# Patient Record
Sex: Male | Born: 1960 | Race: White | Hispanic: No | Marital: Married | Smoking: Never smoker
Health system: Southern US, Community
[De-identification: ages and names within clinical notes are randomized; demographics above are authoritative.]

## PROBLEM LIST (undated history)

## (undated) DIAGNOSIS — E785 Hyperlipidemia, unspecified: Secondary | ICD-10-CM

## (undated) DIAGNOSIS — L301 Dyshidrosis [pompholyx]: Secondary | ICD-10-CM

## (undated) DIAGNOSIS — I10 Essential (primary) hypertension: Secondary | ICD-10-CM

## (undated) DIAGNOSIS — I6529 Occlusion and stenosis of unspecified carotid artery: Secondary | ICD-10-CM

## (undated) DIAGNOSIS — F419 Anxiety disorder, unspecified: Secondary | ICD-10-CM

---

## 2015-10-08 DIAGNOSIS — L301 Dyshidrosis [pompholyx]: Secondary | ICD-10-CM | POA: Insufficient documentation

## 2015-10-08 DIAGNOSIS — I6529 Occlusion and stenosis of unspecified carotid artery: Secondary | ICD-10-CM | POA: Insufficient documentation

## 2015-10-08 DIAGNOSIS — I10 Essential (primary) hypertension: Secondary | ICD-10-CM | POA: Insufficient documentation

## 2015-10-08 DIAGNOSIS — Z125 Encounter for screening for malignant neoplasm of prostate: Secondary | ICD-10-CM | POA: Insufficient documentation

## 2015-10-08 DIAGNOSIS — E785 Hyperlipidemia, unspecified: Secondary | ICD-10-CM | POA: Insufficient documentation

## 2015-10-08 DIAGNOSIS — F419 Anxiety disorder, unspecified: Secondary | ICD-10-CM | POA: Insufficient documentation

## 2015-10-08 DIAGNOSIS — E669 Obesity, unspecified: Secondary | ICD-10-CM | POA: Insufficient documentation

## 2016-10-23 DIAGNOSIS — G4709 Other insomnia: Secondary | ICD-10-CM | POA: Insufficient documentation

## 2016-10-29 ENCOUNTER — Other Ambulatory Visit: Payer: Self-pay | Admitting: Orthopedic Surgery

## 2016-10-29 DIAGNOSIS — M5416 Radiculopathy, lumbar region: Secondary | ICD-10-CM

## 2016-10-31 ENCOUNTER — Ambulatory Visit
Admission: RE | Admit: 2016-10-31 | Discharge: 2016-10-31 | Disposition: A | Discharge status: Home / Self Care | Payer: BLUE CROSS/BLUE SHIELD | Source: Ambulatory Visit | Attending: Orthopedic Surgery | Admitting: Orthopedic Surgery

## 2016-10-31 DIAGNOSIS — M5416 Radiculopathy, lumbar region: Secondary | ICD-10-CM

## 2016-11-03 ENCOUNTER — Other Ambulatory Visit: Payer: Self-pay | Admitting: Physical Medicine and Rehabilitation

## 2016-11-03 DIAGNOSIS — M545 Low back pain: Secondary | ICD-10-CM

## 2016-11-06 ENCOUNTER — Ambulatory Visit
Admission: RE | Admit: 2016-11-06 | Discharge: 2016-11-06 | Disposition: A | Discharge status: Home / Self Care | Payer: BLUE CROSS/BLUE SHIELD | Source: Ambulatory Visit | Attending: Physical Medicine and Rehabilitation | Admitting: Physical Medicine and Rehabilitation

## 2016-11-06 ENCOUNTER — Other Ambulatory Visit: Payer: Self-pay | Admitting: Physical Medicine and Rehabilitation

## 2016-11-06 DIAGNOSIS — M545 Low back pain: Secondary | ICD-10-CM

## 2016-11-06 MED ORDER — METHYLPREDNISOLONE ACETATE 40 MG/ML INJ SUSP (RADIOLOG
120.0000 mg | Freq: Once | INTRAMUSCULAR | Status: AC
  Administered 2016-11-06: 120 mg via EPIDURAL

## 2016-11-06 MED ORDER — IOPAMIDOL (ISOVUE-M 200) INJECTION 41%
1.0000 mL | Freq: Once | INTRAMUSCULAR | Status: AC
  Administered 2016-11-06: 1 mL via EPIDURAL

## 2016-11-06 NOTE — Discharge Instructions (Signed)

## 2016-11-07 ENCOUNTER — Other Ambulatory Visit: Payer: BLUE CROSS/BLUE SHIELD

## 2016-11-13 ENCOUNTER — Other Ambulatory Visit: Payer: Self-pay | Admitting: Physical Medicine and Rehabilitation

## 2016-11-13 DIAGNOSIS — M5416 Radiculopathy, lumbar region: Secondary | ICD-10-CM

## 2016-11-24 ENCOUNTER — Ambulatory Visit
Admission: RE | Admit: 2016-11-24 | Discharge: 2016-11-24 | Disposition: A | Discharge status: Home / Self Care | Payer: BLUE CROSS/BLUE SHIELD | Source: Ambulatory Visit | Attending: Physical Medicine and Rehabilitation | Admitting: Physical Medicine and Rehabilitation

## 2016-11-24 DIAGNOSIS — M5416 Radiculopathy, lumbar region: Secondary | ICD-10-CM

## 2016-11-24 MED ORDER — IOPAMIDOL (ISOVUE-M 200) INJECTION 41%
1.0000 mL | Freq: Once | INTRAMUSCULAR | Status: AC
  Administered 2016-11-24: 1 mL via EPIDURAL

## 2016-11-24 MED ORDER — METHYLPREDNISOLONE ACETATE 40 MG/ML INJ SUSP (RADIOLOG
120.0000 mg | Freq: Once | INTRAMUSCULAR | Status: AC
  Administered 2016-11-24: 120 mg via EPIDURAL

## 2016-12-10 ENCOUNTER — Other Ambulatory Visit: Payer: Self-pay | Admitting: Orthopedic Surgery

## 2016-12-10 DIAGNOSIS — M5416 Radiculopathy, lumbar region: Secondary | ICD-10-CM

## 2016-12-17 ENCOUNTER — Other Ambulatory Visit: Payer: BLUE CROSS/BLUE SHIELD

## 2016-12-19 ENCOUNTER — Other Ambulatory Visit: Payer: Self-pay | Admitting: Orthopedic Surgery

## 2016-12-19 ENCOUNTER — Ambulatory Visit
Admission: RE | Admit: 2016-12-19 | Discharge: 2016-12-19 | Disposition: A | Discharge status: Home / Self Care | Payer: BLUE CROSS/BLUE SHIELD | Source: Ambulatory Visit | Attending: Orthopedic Surgery | Admitting: Orthopedic Surgery

## 2016-12-19 DIAGNOSIS — M5416 Radiculopathy, lumbar region: Secondary | ICD-10-CM

## 2016-12-19 MED ORDER — IOPAMIDOL (ISOVUE-M 200) INJECTION 41%
1.0000 mL | Freq: Once | INTRAMUSCULAR | Status: AC
  Administered 2016-12-19: 1 mL via EPIDURAL

## 2016-12-19 MED ORDER — METHYLPREDNISOLONE ACETATE 40 MG/ML INJ SUSP (RADIOLOG
120.0000 mg | Freq: Once | INTRAMUSCULAR | Status: AC
  Administered 2016-12-19: 120 mg via EPIDURAL

## 2016-12-19 NOTE — Discharge Instructions (Signed)

## 2017-04-27 DIAGNOSIS — Z79899 Other long term (current) drug therapy: Secondary | ICD-10-CM | POA: Insufficient documentation

## 2017-08-28 ENCOUNTER — Other Ambulatory Visit: Payer: Self-pay | Admitting: Sports Medicine

## 2017-08-28 DIAGNOSIS — S42114A Nondisplaced fracture of body of scapula, right shoulder, initial encounter for closed fracture: Secondary | ICD-10-CM

## 2017-08-31 ENCOUNTER — Ambulatory Visit
Admission: RE | Admit: 2017-08-31 | Discharge: 2017-08-31 | Disposition: A | Discharge status: Home / Self Care | Payer: BLUE CROSS/BLUE SHIELD | Source: Ambulatory Visit | Attending: Sports Medicine | Admitting: Sports Medicine

## 2017-08-31 DIAGNOSIS — S42114A Nondisplaced fracture of body of scapula, right shoulder, initial encounter for closed fracture: Secondary | ICD-10-CM

## 2018-06-20 ENCOUNTER — Other Ambulatory Visit: Payer: Self-pay

## 2018-06-20 ENCOUNTER — Emergency Department (HOSPITAL_COMMUNITY)
Admission: EM | Admit: 2018-06-20 | Discharge: 2018-06-21 | Disposition: A | Discharge status: Home / Self Care | Payer: BLUE CROSS/BLUE SHIELD | Attending: Emergency Medicine | Admitting: Emergency Medicine

## 2018-06-20 ENCOUNTER — Encounter (HOSPITAL_COMMUNITY): Payer: Self-pay | Admitting: *Deleted

## 2018-06-20 DIAGNOSIS — E785 Hyperlipidemia, unspecified: Secondary | ICD-10-CM | POA: Insufficient documentation

## 2018-06-20 DIAGNOSIS — Z79899 Other long term (current) drug therapy: Secondary | ICD-10-CM | POA: Diagnosis not present

## 2018-06-20 DIAGNOSIS — I10 Essential (primary) hypertension: Secondary | ICD-10-CM | POA: Insufficient documentation

## 2018-06-20 DIAGNOSIS — K5641 Fecal impaction: Secondary | ICD-10-CM | POA: Diagnosis not present

## 2018-06-20 DIAGNOSIS — K5900 Constipation, unspecified: Secondary | ICD-10-CM | POA: Diagnosis present

## 2018-06-20 MED ORDER — MAGNESIUM HYDROXIDE 400 MG/5ML PO SUSP
960.0000 mL | Freq: Once | ORAL | Status: AC
  Administered 2018-06-20: 960 mL via RECTAL
  Filled 2018-06-20: qty 473

## 2018-06-20 MED ORDER — DOCUSATE SODIUM 100 MG PO CAPS: 100.0000 mg | ORAL_CAPSULE | Freq: Two times a day (BID) | ORAL | 0 refills | Status: DC

## 2018-06-20 MED ORDER — POLYETHYLENE GLYCOL 3350 17 GM/SCOOP PO POWD: ORAL | 0 refills | Status: DC

## 2018-06-20 MED ORDER — SODIUM CHLORIDE 0.9 % IV BOLUS
1000.0000 mL | Freq: Once | INTRAVENOUS | Status: AC
  Administered 2018-06-20: 1000 mL via INTRAVENOUS

## 2018-06-20 NOTE — ED Notes (Signed)
LBM Friday and hard stool per pt., took enema and stool softeners earlier today

## 2018-06-20 NOTE — Discharge Instructions (Signed)
Contact a health care provider if: °You have ongoing pain in your rectum. °You need to use an enema or a suppository more than 2 times a week. °You have rectal bleeding. °You continue to have problems. The problems may include not being able to go to the bathroom and long-term (chronic) constipation. °You have pain in your abdomen. °You have thin, pencil-like stools. °Get help right away if: °You have black or tarry stools. °

## 2018-06-20 NOTE — ED Triage Notes (Signed)
Pt states he has not had a BM in 2 days; pt is having abdominal pain; pt states he has used an enema and stool softeners with no relief; pt's spouse tried to disimpact pt and was unable to get to the stool

## 2018-06-20 NOTE — ED Provider Notes (Signed)
[facility] EMERGENCY DEPARTMENT Provider Note   CSN: [id number] Arrival date & time: 06/20/18  1903     History   Chief Complaint Chief Complaint  Patient presents with  . Constipation    HPI Brady [name] is a 57 y.o. male who presents with rectal urgency. He has not had a BM in 2 days. He complains of a sense of fullness and urgency to defecate. He can feel the stool in his rectum but is unable to pass it. He tried a fleets enema and 2 colace tablets today without relief.  He denies a hx of constipation. He denies abdominal pain, n/v.   HPI  History reviewed. No pertinent past medical history.  Patient Active Problem List   Diagnosis Date Noted  . Other insomnia 10/23/2016  . Anxiety disorder 10/08/2015  . Carotid artery stenosis, asymptomatic 10/08/2015  . Dyshydrosis 10/08/2015  . Essential hypertension 10/08/2015  . Hyperlipidemia LDL goal <100 10/08/2015  . Obesity (BMI 30-39.9) 10/08/2015  . Screening for prostate cancer 10/08/2015    History reviewed. No pertinent surgical history.      Home Medications    Prior to Admission medications   Medication Sig Start Date End Date Taking? Authorizing Provider  ALPRAZolam Prudy Feeler) 0.25 MG tablet Take 0.25 mg by mouth daily as needed for anxiety.  10/28/16  Yes [provider]  aspirin 81 MG chewable tablet Chew 81 mg by mouth daily.    Yes [provider]  bisacodyl (FLEET) 10 MG/30ML ENEM Place 10 mg rectally once.   Yes [provider]  clobetasol cream (TEMOVATE) 0.05 % Apply 1 application topically daily as needed.  10/23/16  Yes [provider]  docusate sodium (COLACE) 100 MG capsule Take 300 mg by mouth once as needed for mild constipation.   Yes [provider]  DULoxetine (CYMBALTA) 60 MG capsule Take 1 capsule by mouth at bedtime.  12/07/17  Yes [provider]  lisinopril (PRINIVIL,ZESTRIL) 20 MG tablet Take 20 mg by mouth daily.  10/23/16  Yes [provider]  lovastatin (MEVACOR) 20 MG tablet Take 20 mg by mouth. 10/23/16  Yes [provider]  Multiple Vitamin (MULTI-VITAMINS) TABS Take 1 tablet by mouth daily.    Yes [provider]  naproxen sodium (ALEVE) 220 MG tablet Take 220 mg by mouth 2 (two) times daily as needed (pain).   Yes [provider]  terbinafine (LAMISIL) 250 MG tablet Take 1 tablet by mouth daily. 03/08/18  Yes [provider]    Family History History reviewed. No pertinent family history.  Social History Social History   Tobacco Use  . Smoking status: Never Smoker  . Smokeless tobacco: Never Used  Substance Use Topics  . Alcohol use: Not Currently  . Drug use: Not Currently     Allergies   Patient has no known allergies.   Review of Systems Review of Systems   Physical Exam Updated Vital Signs BP (!) 94/48   Pulse (!) 119   Temp 97.8 F (36.6 C) (Oral)   Resp (!) 22   Ht 5\' 10"  (1.778 m)   Wt 108.9 kg   SpO2 100%   BMI 34.44 kg/m   Physical Exam  Constitutional: He appears well-developed and well-nourished. No distress.  Appears uncomfortable  HENT:  Head: Normocephalic and atraumatic.  Eyes: Conjunctivae are normal. No scleral icterus.  Neck: Normal range of motion. Neck supple.  Cardiovascular: Regular rhythm and normal heart sounds.  Tachycardic  Pulmonary/Chest: Effort  normal and breath sounds normal. No respiratory distress.  Abdominal: Soft. He exhibits no distension. There is no tenderness.  Genitourinary:  Genitourinary Comments: Normal rectal tone. Firm, clay like fecal impaction palpated within the rectum.  Musculoskeletal: He exhibits no edema.  Neurological: He is alert.  Skin: Skin is warm and dry. He is not diaphoretic.  Psychiatric: His behavior is normal.  Nursing note and vitals reviewed.    ED Treatments / Results  Labs (all labs ordered are listed, but only abnormal results are displayed) Labs Reviewed - No data to  display  EKG None  Radiology No results found.  Procedures Fecal disimpaction Date/Time: 06/20/2018 10:06 PM Performed by: Arthor Captain, PA-C Authorized by: Arthor Captain, PA-C  Consent: Verbal consent obtained. Risks and benefits: risks, benefits and alternatives were discussed Consent given by: patient Patient understanding: patient states understanding of the procedure being performed Patient identity confirmed: verbally with patient and provided demographic data Local anesthesia used: no  Anesthesia: Local anesthesia used: no Patient tolerance: Patient tolerated the procedure well with no immediate complications    (including critical care time)  Medications Ordered in ED Medications - No data to display   Initial Impression / Assessment and Plan / ED Course  I have reviewed the triage vital signs and the nursing notes.  Pertinent labs & imaging results that were available during my care of the patient were reviewed by me and considered in my medical decision making (see chart for details).     Patient with fecal impaction.  He was disimpacted and then given an enema which produced a large cathartic bowel movement.  The patient feels greatly improved.  He was also given a bolus of fluids and his vital signs have normalized.  I suspect this is tachycardia secondary to significant disc comfort and even some vagal response.  The patient feels greatly improved.  I have no suspicion for small bowel obstruction, diverticulitis or rectal mass.  Advised daily MiraLAX and Colace.  Follow-up with PCP.  He appears appropriate for discharge at this time  Final Clinical Impressions(s) / ED Diagnoses   Final diagnoses:  Fecal impaction in rectum [facility])    ED Discharge Orders    None       Arthor Captain, PA-C 06/21/18 0026    Mancel Bale, MD 06/22/18 2045

## 2018-06-20 NOTE — ED Notes (Signed)
ED Provider at bedside. 

## 2018-11-15 ENCOUNTER — Emergency Department (HOSPITAL_COMMUNITY): Payer: BLUE CROSS/BLUE SHIELD

## 2018-11-15 ENCOUNTER — Emergency Department (HOSPITAL_COMMUNITY)
Admission: EM | Admit: 2018-11-15 | Discharge: 2018-11-16 | Disposition: A | Discharge status: Home / Self Care | Payer: BLUE CROSS/BLUE SHIELD | Attending: Emergency Medicine | Admitting: Emergency Medicine

## 2018-11-15 ENCOUNTER — Other Ambulatory Visit: Payer: Self-pay

## 2018-11-15 ENCOUNTER — Encounter (HOSPITAL_COMMUNITY): Payer: Self-pay | Admitting: *Deleted

## 2018-11-15 DIAGNOSIS — S42211A Unspecified displaced fracture of surgical neck of right humerus, initial encounter for closed fracture: Secondary | ICD-10-CM | POA: Insufficient documentation

## 2018-11-15 DIAGNOSIS — Z79899 Other long term (current) drug therapy: Secondary | ICD-10-CM | POA: Diagnosis not present

## 2018-11-15 DIAGNOSIS — W19XXXA Unspecified fall, initial encounter: Secondary | ICD-10-CM

## 2018-11-15 DIAGNOSIS — S4991XA Unspecified injury of right shoulder and upper arm, initial encounter: Secondary | ICD-10-CM | POA: Diagnosis present

## 2018-11-15 DIAGNOSIS — S42112A Displaced fracture of body of scapula, left shoulder, initial encounter for closed fracture: Secondary | ICD-10-CM | POA: Insufficient documentation

## 2018-11-15 DIAGNOSIS — W108XXA Fall (on) (from) other stairs and steps, initial encounter: Secondary | ICD-10-CM | POA: Diagnosis not present

## 2018-11-15 DIAGNOSIS — Y9389 Activity, other specified: Secondary | ICD-10-CM | POA: Insufficient documentation

## 2018-11-15 DIAGNOSIS — Y999 Unspecified external cause status: Secondary | ICD-10-CM | POA: Diagnosis not present

## 2018-11-15 DIAGNOSIS — Y92009 Unspecified place in unspecified non-institutional (private) residence as the place of occurrence of the external cause: Secondary | ICD-10-CM | POA: Insufficient documentation

## 2018-11-15 DIAGNOSIS — I10 Essential (primary) hypertension: Secondary | ICD-10-CM | POA: Insufficient documentation

## 2018-11-15 DIAGNOSIS — Z7982 Long term (current) use of aspirin: Secondary | ICD-10-CM | POA: Diagnosis not present

## 2018-11-15 DIAGNOSIS — S42102A Fracture of unspecified part of scapula, left shoulder, initial encounter for closed fracture: Secondary | ICD-10-CM

## 2018-11-15 MED ORDER — HYDROMORPHONE HCL 1 MG/ML IJ SOLN
1.0000 mg | Freq: Once | INTRAMUSCULAR | Status: AC
  Administered 2018-11-15: 1 mg via INTRAVENOUS
  Filled 2018-11-15: qty 1

## 2018-11-15 MED ORDER — ONDANSETRON HCL 4 MG/2ML IJ SOLN
4.0000 mg | Freq: Once | INTRAMUSCULAR | Status: AC
  Administered 2018-11-15: 4 mg via INTRAVENOUS
  Filled 2018-11-15: qty 2

## 2018-11-15 NOTE — ED Provider Notes (Signed)
[facility] EMERGENCY DEPARTMENT Provider Note   CSN: [id number] Arrival date & time: 11/15/18  2205    History   Chief Complaint Chief Complaint  Patient presents with  . Fall    HPI Brady [name] is a 58 y.o. male with a history of chronic sciatica, HTN and hyperlipidemia presenting with a pain and injury sustained in a fall occurring prior to arrival.  He was walking up a wooden flight of steps in his home and when he reached the top step he lost his balance and tumbled backward down the entire flight of steps.  He denies hitting his head, denies loc and also denies neck pain but does endorse pain across his bilateral shoulders.  He was unable to get off the floor and arrived by ems.  He received a dose of fentanyl prior to arrival with transient improvement in pain.  He denies chest or abdominal pain and denies lower extremity pain or injury.      The history is provided by the patient and the spouse.    History reviewed. No pertinent past medical history.  Patient Active Problem List   Diagnosis Date Noted  . Other insomnia 10/23/2016  . Anxiety disorder 10/08/2015  . Carotid artery stenosis, asymptomatic 10/08/2015  . Dyshydrosis 10/08/2015  . Essential hypertension 10/08/2015  . Hyperlipidemia LDL goal <100 10/08/2015  . Obesity (BMI 30-39.9) 10/08/2015  . Screening for prostate cancer 10/08/2015    History reviewed. No pertinent surgical history.      Home Medications    Prior to Admission medications   Medication Sig Start Date End Date Taking? Authorizing Provider  ALPRAZolam Prudy Feeler) 0.25 MG tablet Take 0.25 mg by mouth daily as needed for anxiety.  10/28/16   [provider]  aspirin 81 MG chewable tablet Chew 81 mg by mouth daily.     [provider]  bisacodyl (FLEET) 10 MG/30ML ENEM Place 10 mg rectally once.    [provider]  clobetasol cream (TEMOVATE) 0.05 % Apply 1 application topically daily as needed.  10/23/16    [provider]  docusate sodium (COLACE) 100 MG capsule Take 1 capsule (100 mg total) by mouth every 12 (twelve) hours. 06/20/18   Arthor Captain, PA-C  DULoxetine (CYMBALTA) 60 MG capsule Take 1 capsule by mouth at bedtime.  12/07/17   [provider]  lisinopril (PRINIVIL,ZESTRIL) 20 MG tablet Take 20 mg by mouth daily.  10/23/16   [provider]  lovastatin (MEVACOR) 20 MG tablet Take 20 mg by mouth. 10/23/16   [provider]  Multiple Vitamin (MULTI-VITAMINS) TABS Take 1 tablet by mouth daily.     [provider]  naproxen sodium (ALEVE) 220 MG tablet Take 220 mg by mouth 2 (two) times daily as needed (pain).    [provider]  oxyCODONE-acetaminophen (PERCOCET/ROXICET) 5-325 MG tablet Take 1 tablet by mouth every 4 (four) hours as needed. 11/16/18   Avrian Delfavero, Raynelle Fanning, PA-C  polyethylene glycol powder (GLYCOLAX/MIRALAX) powder 1 capful in 10 ounces of fluid 1-3 times daily until you are having daily soft bowel movements. 06/20/18   Harris, Abigail, PA-C  terbinafine (LAMISIL) 250 MG tablet Take 1 tablet by mouth daily. 03/08/18   [provider]    Family History History reviewed. No pertinent family history.  Social History Social History   Tobacco Use  . Smoking status: Never Smoker  . Smokeless tobacco: Never Used  Substance Use Topics  . Alcohol use: Not Currently  . Drug use:  Not Currently     Allergies   Patient has no known allergies.   Review of Systems Review of Systems  Constitutional: Negative for fever.  HENT: Negative.   Eyes: Negative for visual disturbance.  Respiratory: Negative.   Cardiovascular: Negative.   Gastrointestinal: Negative for nausea and vomiting.  Musculoskeletal: Positive for arthralgias and back pain. Negative for joint swelling, myalgias and neck pain.  Neurological: Negative for dizziness, weakness, numbness and headaches.     Physical Exam Updated Vital Signs BP 137/84   Pulse  (!) 115   Temp 97.7 F (36.5 C) (Oral)   Resp 19   Ht 5\' 10"  (1.778 m)   Wt 108.9 kg   SpO2 94%   BMI 34.44 kg/m   Physical Exam Vitals signs and nursing note reviewed.  Constitutional:      Appearance: Normal appearance. He is well-developed.  HENT:     Head: Normocephalic and atraumatic.     Right Ear: Tympanic membrane normal. No hemotympanum.     Left Ear: No hemotympanum.  Eyes:     Extraocular Movements:     Right eye: Normal extraocular motion.     Left eye: Normal extraocular motion.     Conjunctiva/sclera: Conjunctivae normal.  Neck:     Musculoskeletal: Normal range of motion. Normal range of motion. Muscular tenderness present. No injury, pain with movement or spinous process tenderness.   Cardiovascular:     Rate and Rhythm: Regular rhythm. Tachycardia present.     Pulses:          Radial pulses are 2+ on the right side and 2+ on the left side.     Heart sounds: Normal heart sounds.  Pulmonary:     Effort: Pulmonary effort is normal.     Breath sounds: Normal breath sounds. No wheezing.  Chest:     Chest wall: No tenderness.  Abdominal:     General: Bowel sounds are normal.     Palpations: Abdomen is soft.     Tenderness: There is no abdominal tenderness. There is no guarding.  Musculoskeletal: Normal range of motion.     Right upper arm: He exhibits bony tenderness and swelling.       Arms:     Comments: Patient is palpation across his bilateral upper shoulders and especially at his right lateral shoulder over the deltoid region.  Sensation is intact.  He has equal grip strength of his bilateral hands.  There is no elbow forearm wrist or hand pain bilaterally.  He is very tender also across his left upper scapular region.  Skin:    General: Skin is warm and dry.  Neurological:     General: No focal deficit present.     Mental Status: He is alert and oriented to person, place, and time.      ED Treatments / Results  Labs (all labs ordered are  listed, but only abnormal results are displayed) Labs Reviewed  CBC WITH DIFFERENTIAL/PLATELET - Abnormal; Notable for the following components:      Result Value   WBC 16.5 (*)    Neutro Abs 14.0 (*)    All other components within normal limits  BASIC METABOLIC PANEL - Abnormal; Notable for the following components:   Glucose, Bld 144 (*)    BUN 21 (*)    All other components within normal limits    EKG None  Radiology Dg Chest 1 View  Result Date: 11/16/2018 CLINICAL DATA:  Fall down steps. EXAM: CHEST  1 VIEW COMPARISON:  None. FINDINGS: Heart and mediastinal contours are within normal limits. No focal opacities or effusions. No acute bony abnormality. IMPRESSION: No active disease. Electronically Signed   By: Charlett Nose M.D.   On: 11/16/2018 00:17   Dg Scapula Left  Result Date: 11/16/2018 CLINICAL DATA:  Fall down steps.  Left scapular pain. EXAM: LEFT SCAPULA - 2+ VIEWS COMPARISON:  Left shoulder series earlier today. FINDINGS: Displaced fractures are noted through the left scapula. Fracture noted near the base of the acromion. No subluxation or dislocation of the left shoulder. IMPRESSION: Displaced scapular body fractures and nondisplaced fracture near the base of the acromion. Electronically Signed   By: Charlett Nose M.D.   On: 11/16/2018 01:29   Dg Shoulder Right  Result Date: 11/16/2018 CLINICAL DATA:  Fall down steps.  Pain. EXAM: RIGHT SHOULDER - 2+ VIEW COMPARISON:  None. FINDINGS: Fracture noted through the right humeral neck and proximal shaft. No subluxation or dislocation. Minimal displacement. IMPRESSION: Minimally displaced fracture through the right humeral neck/proximal shaft. Electronically Signed   By: Charlett Nose M.D.   On: 11/16/2018 00:18   Ct Head Wo Contrast  Result Date: 11/16/2018 CLINICAL DATA:  Fall down stairs. EXAM: CT HEAD WITHOUT CONTRAST CT CERVICAL SPINE WITHOUT CONTRAST TECHNIQUE: Multidetector CT imaging of the head and cervical spine was  performed following the standard protocol without intravenous contrast. Multiplanar CT image reconstructions of the cervical spine were also generated. COMPARISON:  None. FINDINGS: CT HEAD FINDINGS Brain: Mild age related volume loss. No acute intracranial abnormality. Specifically, no hemorrhage, hydrocephalus, mass lesion, acute infarction, or significant intracranial injury. Vascular: No hyperdense vessel or unexpected calcification. Skull: No acute calvarial abnormality. Sinuses/Orbits: Visualized paranasal sinuses and mastoids clear. Orbital soft tissues unremarkable. Other: None CT CERVICAL SPINE FINDINGS Alignment: No subluxation Skull base and vertebrae: No acute fracture. No primary bone lesion or focal pathologic process. Soft tissues and spinal canal: No prevertebral fluid or swelling. No visible canal hematoma. Disc levels:  Diffuse degenerative disc and facet disease. Upper chest: No acute findings Other: None IMPRESSION: No acute intracranial abnormality. No acute bony abnormality in the cervical spine. Electronically Signed   By: Charlett Nose M.D.   On: 11/16/2018 00:15   Ct Cervical Spine Wo Contrast  Result Date: 11/16/2018 CLINICAL DATA:  Fall down stairs. EXAM: CT HEAD WITHOUT CONTRAST CT CERVICAL SPINE WITHOUT CONTRAST TECHNIQUE: Multidetector CT imaging of the head and cervical spine was performed following the standard protocol without intravenous contrast. Multiplanar CT image reconstructions of the cervical spine were also generated. COMPARISON:  None. FINDINGS: CT HEAD FINDINGS Brain: Mild age related volume loss. No acute intracranial abnormality. Specifically, no hemorrhage, hydrocephalus, mass lesion, acute infarction, or significant intracranial injury. Vascular: No hyperdense vessel or unexpected calcification. Skull: No acute calvarial abnormality. Sinuses/Orbits: Visualized paranasal sinuses and mastoids clear. Orbital soft tissues unremarkable. Other: None CT CERVICAL SPINE  FINDINGS Alignment: No subluxation Skull base and vertebrae: No acute fracture. No primary bone lesion or focal pathologic process. Soft tissues and spinal canal: No prevertebral fluid or swelling. No visible canal hematoma. Disc levels:  Diffuse degenerative disc and facet disease. Upper chest: No acute findings Other: None IMPRESSION: No acute intracranial abnormality. No acute bony abnormality in the cervical spine. Electronically Signed   By: Charlett Nose M.D.   On: 11/16/2018 00:15   Dg Shoulder Left  Result Date: 11/16/2018 CLINICAL DATA:  The patient fell backwards and down about 15 steps today. He is now having  severe bilateral shoulder pain and no mobility to them. EXAM: LEFT SHOULDER - 2+ VIEW COMPARISON:  None. FINDINGS: No fracture. Glenohumeral and AC joints are normally spaced and aligned. No arthropathic changes. Scapula is not well assessed on this study. If there is pain over the scapula, recommend dedicated scapular radiographs for further assessment. Soft tissues are unremarkable. IMPRESSION: No fracture or dislocation. Electronically Signed   By: Amie Portland M.D.   On: 11/16/2018 00:19    Procedures Procedures (including critical care time)  Medications Ordered in ED Medications  HYDROmorphone (DILAUDID) injection 1 mg (1 mg Intravenous Given 11/15/18 2335)  ondansetron (ZOFRAN) injection 4 mg (4 mg Intravenous Given 11/15/18 2335)  HYDROmorphone (DILAUDID) injection 1 mg (1 mg Intravenous Given 11/16/18 0056)     Initial Impression / Assessment and Plan / ED Course  I have reviewed the triage vital signs and the nursing notes.  Pertinent labs & imaging results that were available during my care of the patient were reviewed by me and considered in my medical decision making (see chart for details).       Pt with right humeral neck fracture.  He has seen [place] [facility], last visit was 11 months ago, referral back for further tx of sx.  Scapula fx per dedicated imaging -  CT chest ordered to rule out chest injury.    Discussed with Dr. Lynelle Doctor who will dispo pt pending these results.  Final Clinical Impressions(s) / ED Diagnoses   Final diagnoses:  Closed displaced fracture of surgical neck of right humerus, unspecified fracture morphology, initial encounter  Closed fracture of left scapula, unspecified part of scapula, initial encounter    ED Discharge Orders         Ordered    oxyCODONE-acetaminophen (PERCOCET/ROXICET) 5-325 MG tablet  Every 4 hours PRN     11/16/18 0153           Burgess Amor, PA-C 11/16/18 0203    Devoria Albe, MD 11/16/18 [phone]

## 2018-11-15 NOTE — ED Triage Notes (Signed)
Pt brought in by [facility] for c/o fall; pt states he has sciatic pain and when he got to the top of the stairs he lost his balance and fell backwards down 15-16 stairs; pt having bilateral arm pain; ems administered of fentanyl en route to hospital

## 2018-11-16 ENCOUNTER — Encounter (HOSPITAL_COMMUNITY): Payer: Self-pay | Admitting: Radiology

## 2018-11-16 ENCOUNTER — Emergency Department (HOSPITAL_COMMUNITY): Payer: BLUE CROSS/BLUE SHIELD

## 2018-11-16 LAB — CBC WITH DIFFERENTIAL/PLATELET
Abs Immature Granulocytes: 0.07 10*3/uL (ref 0.00–0.07)
BASOS ABS: 0 10*3/uL (ref 0.0–0.1)
Basophils Relative: 0 %
Eosinophils Absolute: 0 10*3/uL (ref 0.0–0.5)
Eosinophils Relative: 0 %
HCT: 41.1 % (ref 39.0–52.0)
Hemoglobin: 13.3 g/dL (ref 13.0–17.0)
IMMATURE GRANULOCYTES: 0 %
Lymphocytes Relative: 8 %
Lymphs Abs: 1.3 10*3/uL (ref 0.7–4.0)
MCH: 29.5 pg (ref 26.0–34.0)
MCHC: 32.4 g/dL (ref 30.0–36.0)
MCV: 91.1 fL (ref 80.0–100.0)
Monocytes Absolute: 1 10*3/uL (ref 0.1–1.0)
Monocytes Relative: 6 %
NEUTROS PCT: 86 %
Neutro Abs: 14 10*3/uL — ABNORMAL HIGH (ref 1.7–7.7)
Platelets: 266 10*3/uL (ref 150–400)
RBC: 4.51 MIL/uL (ref 4.22–5.81)
RDW: 13.1 % (ref 11.5–15.5)
WBC: 16.5 10*3/uL — ABNORMAL HIGH (ref 4.0–10.5)
nRBC: 0 % (ref 0.0–0.2)

## 2018-11-16 LAB — BASIC METABOLIC PANEL
Anion gap: 9 (ref 5–15)
BUN: 21 mg/dL — ABNORMAL HIGH (ref 6–20)
CO2: 22 mmol/L (ref 22–32)
Calcium: 8.9 mg/dL (ref 8.9–10.3)
Chloride: 108 mmol/L (ref 98–111)
Creatinine, Ser: 0.88 mg/dL (ref 0.61–1.24)
GFR calc Af Amer: >60 mL/min (ref >60)
GFR calc non Af Amer: >60 mL/min (ref >60)
GLUCOSE: 144 mg/dL — AB (ref 70–99)
Potassium: 3.7 mmol/L (ref 3.5–5.1)
Sodium: 139 mmol/L (ref 135–145)

## 2018-11-16 MED ORDER — IOHEXOL 350 MG/ML SOLN
100.0000 mL | Freq: Once | INTRAVENOUS | Status: AC | PRN
  Administered 2018-11-16: 100 mL via INTRAVENOUS

## 2018-11-16 MED ORDER — HYDROMORPHONE HCL 1 MG/ML IJ SOLN
1.0000 mg | Freq: Once | INTRAMUSCULAR | Status: AC
  Administered 2018-11-16: 1 mg via INTRAVENOUS
  Filled 2018-11-16: qty 1

## 2018-11-16 MED ORDER — OXYCODONE-ACETAMINOPHEN 5-325 MG PO TABS: 1.0000 | ORAL_TABLET | ORAL | 0 refills | Status: DC | PRN

## 2018-11-16 NOTE — Discharge Instructions (Signed)
Wear your sling at all times to protect your right shoulder fracture.  You may take the oxycodone prescribed for pain relief.  This will make you drowsy - do not drive within 4 hours of taking this medication. Continue using ice as much as is comfortable for your right shoulder and your left scapula (shoulder blade).  Call your orthopedist tomorrow for an appointment this week for a recheck of your injuries.

## 2018-11-17 ENCOUNTER — Encounter (HOSPITAL_COMMUNITY): Payer: Self-pay | Admitting: *Deleted

## 2018-11-18 ENCOUNTER — Inpatient Hospital Stay (HOSPITAL_COMMUNITY): Payer: BLUE CROSS/BLUE SHIELD | Admitting: Certified Registered Nurse Anesthetist

## 2018-11-18 ENCOUNTER — Encounter (HOSPITAL_COMMUNITY): Payer: Self-pay | Admitting: *Deleted

## 2018-11-18 ENCOUNTER — Inpatient Hospital Stay (HOSPITAL_COMMUNITY): Payer: BLUE CROSS/BLUE SHIELD

## 2018-11-18 ENCOUNTER — Encounter (HOSPITAL_COMMUNITY)
Admission: RE | Disposition: A | Discharge status: Home Health | Payer: Self-pay | Source: Home / Self Care | Attending: Orthopedic Surgery

## 2018-11-18 ENCOUNTER — Other Ambulatory Visit: Payer: Self-pay

## 2018-11-18 ENCOUNTER — Observation Stay (HOSPITAL_COMMUNITY)
Admission: RE | Admit: 2018-11-18 | Discharge: 2018-11-19 | Disposition: A | Discharge status: Home Health | Payer: BLUE CROSS/BLUE SHIELD | Attending: Orthopedic Surgery | Admitting: Orthopedic Surgery

## 2018-11-18 DIAGNOSIS — S42202A Unspecified fracture of upper end of left humerus, initial encounter for closed fracture: Secondary | ICD-10-CM | POA: Diagnosis not present

## 2018-11-18 DIAGNOSIS — S42209A Unspecified fracture of upper end of unspecified humerus, initial encounter for closed fracture: Secondary | ICD-10-CM | POA: Diagnosis present

## 2018-11-18 DIAGNOSIS — Z79899 Other long term (current) drug therapy: Secondary | ICD-10-CM | POA: Diagnosis not present

## 2018-11-18 DIAGNOSIS — E669 Obesity, unspecified: Secondary | ICD-10-CM | POA: Insufficient documentation

## 2018-11-18 DIAGNOSIS — E785 Hyperlipidemia, unspecified: Secondary | ICD-10-CM | POA: Insufficient documentation

## 2018-11-18 DIAGNOSIS — F419 Anxiety disorder, unspecified: Secondary | ICD-10-CM | POA: Insufficient documentation

## 2018-11-18 DIAGNOSIS — Z9181 History of falling: Secondary | ICD-10-CM | POA: Insufficient documentation

## 2018-11-18 DIAGNOSIS — Z6834 Body mass index (BMI) 34.0-34.9, adult: Secondary | ICD-10-CM | POA: Diagnosis not present

## 2018-11-18 DIAGNOSIS — S42112A Displaced fracture of body of scapula, left shoulder, initial encounter for closed fracture: Secondary | ICD-10-CM | POA: Insufficient documentation

## 2018-11-18 DIAGNOSIS — Z7982 Long term (current) use of aspirin: Secondary | ICD-10-CM | POA: Insufficient documentation

## 2018-11-18 DIAGNOSIS — W109XXA Fall (on) (from) unspecified stairs and steps, initial encounter: Secondary | ICD-10-CM | POA: Diagnosis not present

## 2018-11-18 DIAGNOSIS — I10 Essential (primary) hypertension: Secondary | ICD-10-CM | POA: Insufficient documentation

## 2018-11-18 DIAGNOSIS — S42309A Unspecified fracture of shaft of humerus, unspecified arm, initial encounter for closed fracture: Secondary | ICD-10-CM

## 2018-11-18 HISTORY — DX: Dyshidrosis (pompholyx): L30.1

## 2018-11-18 HISTORY — PX: ORIF HUMERUS FRACTURE: SHX2126

## 2018-11-18 HISTORY — DX: Anxiety disorder, unspecified: F41.9

## 2018-11-18 HISTORY — DX: Occlusion and stenosis of unspecified carotid artery: I65.29

## 2018-11-18 HISTORY — DX: Essential (primary) hypertension: I10

## 2018-11-18 HISTORY — DX: Hyperlipidemia, unspecified: E78.5

## 2018-11-18 SURGERY — OPEN REDUCTION INTERNAL FIXATION (ORIF) PROXIMAL HUMERUS FRACTURE
Anesthesia: General | Site: Arm Upper | Laterality: Right

## 2018-11-18 MED ORDER — SODIUM CHLORIDE 0.9 % IV SOLN
INTRAVENOUS | Status: DC | PRN
  Administered 2018-11-18: 100 ug/min via INTRAVENOUS

## 2018-11-18 MED ORDER — SUGAMMADEX SODIUM 200 MG/2ML IV SOLN
INTRAVENOUS | Status: AC
  Filled 2018-11-18: qty 4

## 2018-11-18 MED ORDER — PROPOFOL 10 MG/ML IV BOLUS
INTRAVENOUS | Status: AC
  Filled 2018-11-18: qty 20

## 2018-11-18 MED ORDER — ROCURONIUM BROMIDE 10 MG/ML (PF) SYRINGE
PREFILLED_SYRINGE | INTRAVENOUS | Status: DC | PRN
  Administered 2018-11-18 (×2): 50 mg via INTRAVENOUS

## 2018-11-18 MED ORDER — PHENYLEPHRINE HCL 10 MG/ML IJ SOLN
INTRAMUSCULAR | Status: AC
  Filled 2018-11-18: qty 1

## 2018-11-18 MED ORDER — ONDANSETRON HCL 4 MG/2ML IJ SOLN: 4.0000 mg | Freq: Four times a day (QID) | INTRAMUSCULAR | Status: DC | PRN

## 2018-11-18 MED ORDER — MIDAZOLAM HCL 5 MG/5ML IJ SOLN
INTRAMUSCULAR | Status: DC | PRN
  Administered 2018-11-18: 2 mg via INTRAVENOUS

## 2018-11-18 MED ORDER — FENTANYL CITRATE (PF) 100 MCG/2ML IJ SOLN
INTRAMUSCULAR | Status: AC
  Filled 2018-11-18: qty 2

## 2018-11-18 MED ORDER — PHENOL 1.4 % MT LIQD
1.0000 | OROMUCOSAL | Status: DC | PRN
  Filled 2018-11-18: qty 177

## 2018-11-18 MED ORDER — PHENYLEPHRINE HCL 10 MG/ML IJ SOLN
INTRAMUSCULAR | Status: AC
  Filled 2018-11-18: qty 2

## 2018-11-18 MED ORDER — ONDANSETRON HCL 4 MG/2ML IJ SOLN
INTRAMUSCULAR | Status: AC
  Filled 2018-11-18: qty 2

## 2018-11-18 MED ORDER — DEXAMETHASONE SODIUM PHOSPHATE 10 MG/ML IJ SOLN
INTRAMUSCULAR | Status: AC
  Filled 2018-11-18: qty 1

## 2018-11-18 MED ORDER — CHLORHEXIDINE GLUCONATE 4 % EX LIQD: 60.0000 mL | Freq: Once | CUTANEOUS | Status: DC

## 2018-11-18 MED ORDER — DEXAMETHASONE SODIUM PHOSPHATE 10 MG/ML IJ SOLN
INTRAMUSCULAR | Status: DC | PRN
  Administered 2018-11-18: 10 mg via INTRAVENOUS

## 2018-11-18 MED ORDER — ACETAMINOPHEN 500 MG PO TABS
1000.0000 mg | ORAL_TABLET | Freq: Once | ORAL | Status: AC
  Administered 2018-11-18: 1000 mg via ORAL
  Filled 2018-11-18: qty 2

## 2018-11-18 MED ORDER — METHOCARBAMOL 500 MG PO TABS: 500.0000 mg | ORAL_TABLET | Freq: Four times a day (QID) | ORAL | Status: DC | PRN

## 2018-11-18 MED ORDER — LACTATED RINGERS IV SOLN
INTRAVENOUS | Status: DC
  Administered 2018-11-18: 07:00:00 via INTRAVENOUS

## 2018-11-18 MED ORDER — ROCURONIUM BROMIDE 10 MG/ML (PF) SYRINGE
PREFILLED_SYRINGE | INTRAVENOUS | Status: AC
  Filled 2018-11-18: qty 10

## 2018-11-18 MED ORDER — DIPHENHYDRAMINE HCL 12.5 MG/5ML PO ELIX: 12.5000 mg | ORAL_SOLUTION | ORAL | Status: DC | PRN

## 2018-11-18 MED ORDER — ONDANSETRON HCL 4 MG/2ML IJ SOLN
INTRAMUSCULAR | Status: DC | PRN
  Administered 2018-11-18: 4 mg via INTRAVENOUS

## 2018-11-18 MED ORDER — PROMETHAZINE HCL 25 MG/ML IJ SOLN: 6.2500 mg | INTRAMUSCULAR | Status: DC | PRN

## 2018-11-18 MED ORDER — FENTANYL CITRATE (PF) 100 MCG/2ML IJ SOLN: 25.0000 ug | INTRAMUSCULAR | Status: DC | PRN

## 2018-11-18 MED ORDER — ONDANSETRON HCL 4 MG PO TABS: 4.0000 mg | ORAL_TABLET | Freq: Three times a day (TID) | ORAL | 0 refills | Status: DC | PRN

## 2018-11-18 MED ORDER — PHENYLEPHRINE 40 MCG/ML (10ML) SYRINGE FOR IV PUSH (FOR BLOOD PRESSURE SUPPORT)
PREFILLED_SYRINGE | INTRAVENOUS | Status: DC | PRN
  Administered 2018-11-18: 80 ug via INTRAVENOUS

## 2018-11-18 MED ORDER — METHOCARBAMOL 1000 MG/10ML IJ SOLN
500.0000 mg | Freq: Four times a day (QID) | INTRAVENOUS | Status: DC | PRN
  Filled 2018-11-18: qty 5

## 2018-11-18 MED ORDER — POLYETHYLENE GLYCOL 3350 17 G PO PACK: 17.0000 g | PACK | Freq: Every day | ORAL | Status: DC | PRN

## 2018-11-18 MED ORDER — MENTHOL 3 MG MT LOZG
1.0000 | LOZENGE | OROMUCOSAL | Status: DC | PRN
  Filled 2018-11-18: qty 9

## 2018-11-18 MED ORDER — OXYCODONE HCL 5 MG PO TABS: 10.0000 mg | ORAL_TABLET | ORAL | Status: DC | PRN

## 2018-11-18 MED ORDER — BUPIVACAINE LIPOSOME 1.3 % IJ SUSP
INTRAMUSCULAR | Status: DC | PRN
  Administered 2018-11-18: 10 mL via PERINEURAL

## 2018-11-18 MED ORDER — METOCLOPRAMIDE HCL 5 MG PO TABS: 5.0000 mg | ORAL_TABLET | Freq: Three times a day (TID) | ORAL | Status: DC | PRN

## 2018-11-18 MED ORDER — TRANEXAMIC ACID-NACL 1000-0.7 MG/100ML-% IV SOLN
1000.0000 mg | INTRAVENOUS | Status: AC
  Administered 2018-11-18: 1000 mg via INTRAVENOUS
  Filled 2018-11-18: qty 100

## 2018-11-18 MED ORDER — 0.9 % SODIUM CHLORIDE (POUR BTL) OPTIME
TOPICAL | Status: DC | PRN
  Administered 2018-11-18: 1000 mL

## 2018-11-18 MED ORDER — SUGAMMADEX SODIUM 200 MG/2ML IV SOLN
INTRAVENOUS | Status: DC | PRN
  Administered 2018-11-18: 350 mg via INTRAVENOUS

## 2018-11-18 MED ORDER — POVIDONE-IODINE 10 % EX SWAB
2.0000 "application " | Freq: Once | CUTANEOUS | Status: AC
  Administered 2018-11-18: 2 via TOPICAL

## 2018-11-18 MED ORDER — OXYCODONE HCL 5 MG PO TABS
5.0000 mg | ORAL_TABLET | ORAL | Status: DC | PRN
  Administered 2018-11-18 – 2018-11-19 (×3): 5 mg via ORAL
  Filled 2018-11-18 (×4): qty 1

## 2018-11-18 MED ORDER — OXYCODONE-ACETAMINOPHEN 5-325 MG PO TABS: 1.0000 | ORAL_TABLET | ORAL | 0 refills | Status: DC | PRN

## 2018-11-18 MED ORDER — LIDOCAINE 2% (20 MG/ML) 5 ML SYRINGE
INTRAMUSCULAR | Status: DC | PRN
  Administered 2018-11-18: 80 mg via INTRAVENOUS

## 2018-11-18 MED ORDER — MAGNESIUM CITRATE PO SOLN: 1.0000 | Freq: Once | ORAL | Status: DC | PRN

## 2018-11-18 MED ORDER — MIDAZOLAM HCL 2 MG/2ML IJ SOLN
INTRAMUSCULAR | Status: AC
  Filled 2018-11-18: qty 2

## 2018-11-18 MED ORDER — TRAMADOL HCL 50 MG PO TABS: 50.0000 mg | ORAL_TABLET | Freq: Four times a day (QID) | ORAL | Status: DC | PRN

## 2018-11-18 MED ORDER — BISACODYL 5 MG PO TBEC
5.0000 mg | DELAYED_RELEASE_TABLET | Freq: Every day | ORAL | Status: DC | PRN
  Administered 2018-11-19: 5 mg via ORAL
  Filled 2018-11-18: qty 1

## 2018-11-18 MED ORDER — METOCLOPRAMIDE HCL 5 MG/ML IJ SOLN: 5.0000 mg | Freq: Three times a day (TID) | INTRAMUSCULAR | Status: DC | PRN

## 2018-11-18 MED ORDER — ACETAMINOPHEN 325 MG PO TABS
325.0000 mg | ORAL_TABLET | Freq: Four times a day (QID) | ORAL | Status: DC | PRN
  Administered 2018-11-19: 650 mg via ORAL
  Filled 2018-11-18: qty 2

## 2018-11-18 MED ORDER — FENTANYL CITRATE (PF) 100 MCG/2ML IJ SOLN
INTRAMUSCULAR | Status: DC | PRN
  Administered 2018-11-18 (×2): 50 ug via INTRAVENOUS

## 2018-11-18 MED ORDER — PROPOFOL 10 MG/ML IV BOLUS
INTRAVENOUS | Status: DC | PRN
  Administered 2018-11-18: 200 mg via INTRAVENOUS

## 2018-11-18 MED ORDER — TEMAZEPAM 15 MG PO CAPS
15.0000 mg | ORAL_CAPSULE | Freq: Every evening | ORAL | Status: DC | PRN
  Administered 2018-11-18: 30 mg via ORAL
  Filled 2018-11-18: qty 2

## 2018-11-18 MED ORDER — SUCCINYLCHOLINE CHLORIDE 200 MG/10ML IV SOSY
PREFILLED_SYRINGE | INTRAVENOUS | Status: DC | PRN
  Administered 2018-11-18: 120 mg via INTRAVENOUS

## 2018-11-18 MED ORDER — ONDANSETRON HCL 4 MG PO TABS: 4.0000 mg | ORAL_TABLET | Freq: Four times a day (QID) | ORAL | Status: DC | PRN

## 2018-11-18 MED ORDER — LACTATED RINGERS IV SOLN
INTRAVENOUS | Status: DC
  Administered 2018-11-18 – 2018-11-19 (×2): via INTRAVENOUS

## 2018-11-18 MED ORDER — SODIUM CHLORIDE 0.9 % IV SOLN
INTRAVENOUS | Status: DC | PRN
  Administered 2018-11-18: 50 ug/min via INTRAVENOUS

## 2018-11-18 MED ORDER — CEFAZOLIN SODIUM-DEXTROSE 2-4 GM/100ML-% IV SOLN
2.0000 g | INTRAVENOUS | Status: AC
  Administered 2018-11-18: 2 g via INTRAVENOUS
  Filled 2018-11-18: qty 100

## 2018-11-18 MED ORDER — HYDROMORPHONE HCL 1 MG/ML IJ SOLN: 0.5000 mg | INTRAMUSCULAR | Status: DC | PRN

## 2018-11-18 MED ORDER — DOCUSATE SODIUM 100 MG PO CAPS
100.0000 mg | ORAL_CAPSULE | Freq: Two times a day (BID) | ORAL | Status: DC
  Administered 2018-11-18 – 2018-11-19 (×2): 100 mg via ORAL
  Filled 2018-11-18 (×2): qty 1

## 2018-11-18 MED ORDER — BUPIVACAINE HCL (PF) 0.5 % IJ SOLN
INTRAMUSCULAR | Status: DC | PRN
  Administered 2018-11-18: 15 mL

## 2018-11-18 SURGICAL SUPPLY — 56 items
BAG ZIPLOCK 12X15 (MISCELLANEOUS) ×3 IMPLANT
BIT DRILL 3.2 (BIT) ×2
BIT DRILL 3.2XCALB NS DISP (BIT) ×1 IMPLANT
BIT DRILL CALIBRATED 2.7 (BIT) ×2 IMPLANT
BIT DRILL CALIBRATED 2.7MM (BIT) ×1
BIT DRL 3.2XCALB NS DISP (BIT) ×1
CHLORAPREP W/TINT 26 (MISCELLANEOUS) ×3 IMPLANT
COVER SURGICAL LIGHT HANDLE (MISCELLANEOUS) ×3 IMPLANT
COVER WAND RF STERILE (DRAPES) IMPLANT
DERMABOND ADVANCED (GAUZE/BANDAGES/DRESSINGS) ×2
DERMABOND ADVANCED .7 DNX12 (GAUZE/BANDAGES/DRESSINGS) ×1 IMPLANT
DRAPE C-ARM 42X120 X-RAY (DRAPES) ×3 IMPLANT
DRAPE C-ARMOR (DRAPES) IMPLANT
DRAPE ORTHO SPLIT 77X108 STRL (DRAPES) ×4
DRAPE SURG ORHT 6 SPLT 77X108 (DRAPES) ×2 IMPLANT
DRAPE U-SHAPE 47X51 STRL (DRAPES) ×3 IMPLANT
DRSG AQUACEL AG ADV 3.5X 6 (GAUZE/BANDAGES/DRESSINGS) IMPLANT
DRSG AQUACEL AG ADV 3.5X10 (GAUZE/BANDAGES/DRESSINGS) ×3 IMPLANT
ELECT BLADE TIP CTD 4 INCH (ELECTRODE) ×3 IMPLANT
ELECT REM PT RETURN 15FT ADLT (MISCELLANEOUS) ×3 IMPLANT
GLOVE BIO SURGEON STRL SZ7.5 (GLOVE) ×3 IMPLANT
GLOVE BIO SURGEON STRL SZ8 (GLOVE) ×3 IMPLANT
GLOVE SS BIOGEL STRL SZ 7 (GLOVE) ×1 IMPLANT
GLOVE SS BIOGEL STRL SZ 7.5 (GLOVE) ×1 IMPLANT
GLOVE SUPERSENSE BIOGEL SZ 7 (GLOVE) ×2
GLOVE SUPERSENSE BIOGEL SZ 7.5 (GLOVE) ×2
GOWN STRL REUS W/TWL LRG LVL3 (GOWN DISPOSABLE) ×6 IMPLANT
K-WIRE 2X5 SS THRDED S3 (WIRE) ×3
KIT BASIN OR (CUSTOM PROCEDURE TRAY) ×3 IMPLANT
KIT TURNOVER KIT A (KITS) IMPLANT
KWIRE 2X5 SS THRDED S3 (WIRE) ×1 IMPLANT
MANIFOLD NEPTUNE II (INSTRUMENTS) ×3 IMPLANT
NS IRRIG 1000ML POUR BTL (IV SOLUTION) ×3 IMPLANT
PACK SHOULDER (CUSTOM PROCEDURE TRAY) ×3 IMPLANT
PEG LOCKING 3.2MMX56 (Peg) ×6 IMPLANT
PEG LOCKING 3.2X36 (Screw) ×3 IMPLANT
PEG LOCKING 3.2X38 (Screw) ×6 IMPLANT
PEG LOCKING 3.2X40 (Peg) ×3 IMPLANT
PEG LOCKING 3.2X42 (Screw) ×3 IMPLANT
PEG LOCKING 3.2X48 (Peg) ×3 IMPLANT
PEG LOCKING 3.2X50 (Screw) ×3 IMPLANT
PLATE PROX HUM HI R 3H 80 (Plate) ×3 IMPLANT
PROTECTOR NERVE ULNAR (MISCELLANEOUS) ×3 IMPLANT
PUTTY DBM STAGRAFT PLUS 5CC (Putty) ×3 IMPLANT
SCREW LOCK CORT STAR 3.5X24 (Screw) ×3 IMPLANT
SCREW LOCK CORT STAR 3.5X28 (Screw) ×3 IMPLANT
SCREW LOW PROF TIS 3.5X28MM (Screw) ×3 IMPLANT
SLEEVE MEASURING 3.2 (BIT) ×3 IMPLANT
SLING ARM FOAM STRAP XLG (SOFTGOODS) ×3 IMPLANT
SLING ARM IMMOBILIZER LRG (SOFTGOODS) IMPLANT
SLING ARM IMMOBILIZER MED (SOFTGOODS) IMPLANT
SUT MNCRL AB 3-0 PS2 18 (SUTURE) ×3 IMPLANT
SUT MON AB 2-0 CT1 36 (SUTURE) ×3 IMPLANT
SUT VIC AB 1 CT1 36 (SUTURE) ×3 IMPLANT
TOWEL OR 17X26 10 PK STRL BLUE (TOWEL DISPOSABLE) ×3 IMPLANT
TOWEL OR NON WOVEN STRL DISP B (DISPOSABLE) ×3 IMPLANT

## 2018-11-18 NOTE — Discharge Instructions (Signed)
° °Kevin M. Supple, M.D., F.A.A.O.S. °Orthopaedic Surgery °Specializing in Arthroscopic and Reconstructive °Surgery of the Shoulder °[phone] °[address] - [place], [place] [address] - Fax [phone] ° ° °POST-OP TOTAL SHOULDER REPLACEMENT INSTRUCTIONS ° °1. Call the office at [phone] to schedule your first post-op appointment 10-14 days from the date of your surgery. ° °2. The bandage over your incision is waterproof. You may begin showering with this dressing on. You may leave this dressing on until first follow up appointment within 2 weeks. We prefer you leave this dressing in place until follow up however after 5-7 days if you are having itching or skin irritation and would like to remove it you may do so. Go slow and tug at the borders gently to break the bond the dressing has with the skin. At this point if there is no drainage it is okay to go without a bandage or you may cover it with a light guaze and tape. You can also expect significant bruising around your shoulder that will drift down your arm and into your chest wall. This is very normal and should resolve over several days. ° ° 3. Wear your sling/immobilizer at all times except to perform the exercises below or to occasionally let your arm dangle by your side to stretch your elbow. You also need to sleep in your sling immobilizer until instructed otherwise. It is ok to remove your sling if you are sitting in a controlled environment and allow your arm to rest in a position of comfort by your side or on your lap with pillows to give your neck and skin a break from the sling. You may remove it to allow arm to dangle by side to shower. If you are up walking around and when you go to sleep at night you need to wear it. ° °4. Range of motion to your elbow, wrist, and hand are encouraged 3-5 times daily. Exercise to your hand and fingers helps to reduce swelling you may experience. ° °5. Utilize ice to the shoulder 3-5 times  minimum a day and additionally if you are experiencing pain. ° °6. Prescriptions for a pain medication and a muscle relaxant are provided for you. It is recommended that if you are experiencing pain that you pain medication alone is not controlling, add the muscle relaxant along with the pain medication which can give additional pain relief. The first 1-2 days is generally the most severe of your pain and then should gradually decrease. As your pain lessens it is recommended that you decrease your use of the pain medications to an "as needed basis'" only and to always comply with the recommended dosages of the pain medications. ° °7. Pain medications can produce constipation along with their use. If you experience this, the use of an over the counter stool softener or laxative daily is recommended.  ° °8. For additional questions or concerns, please do not hesitate to call the office. If after hours there is an answering service to forward your concerns to the physician on call. ° °9.Pain control following an exparel block ° °To help control your post-operative pain you received a nerve block  performed with Exparel which is a long acting anesthetic (numbing agent) which can provide pain relief and sensations of numbness (and relief of pain) in the operative shoulder and arm for up to 3 days. Sometimes it provides mixed relief, meaning you may still have numbness in certain areas of the arm but can still   be able to move  parts of that arm, hand, and fingers. We recommend that your prescribed pain medications  be used as needed. We do not feel it is necessary to "pre medicate" and "stay ahead" of pain.  Taking narcotic pain medications when you are not having any pain can lead to unnecessary and potentially dangerous side effects.    POST-OP EXERCISES    May come out of sling to move elbow wrist and hand and to allow arm to dangle by side for hygiene and showers  Do not put weight through the right  arm   General Anesthesia, Adult, Care After This sheet gives you information about how to care for yourself after your procedure. Your health care provider may also give you more specific instructions. If you have problems or questions, contact your health care provider. What can I expect after the procedure? After the procedure, the following side effects are common:  Pain or discomfort at the IV site.  Nausea.  Vomiting.  Sore throat.  Trouble concentrating.  Feeling cold or chills.  Weak or tired.  Sleepiness and fatigue.  Soreness and body aches. These side effects can affect parts of the body that were not involved in surgery. Follow these instructions at home:  For at least 24 hours after the procedure:  Have a responsible adult stay with you. It is important to have someone help care for you until you are awake and alert.  Rest as needed.  Do not: ? Participate in activities in which you could fall or become injured. ? Drive. ? Use heavy machinery. ? Drink alcohol. ? Take sleeping pills or medicines that cause drowsiness. ? Make important decisions or sign legal documents. ? Take care of children on your own. Eating and drinking  Follow any instructions from your health care provider about eating or drinking restrictions.  When you feel hungry, start by eating small amounts of foods that are soft and easy to digest (bland), such as toast. Gradually return to your regular diet.  Drink enough fluid to keep your urine pale yellow.  If you vomit, rehydrate by drinking water, juice, or clear broth. General instructions  If you have sleep apnea, surgery and certain medicines can increase your risk for breathing problems. Follow instructions from your health care provider about wearing your sleep device: ? Anytime you are sleeping, including during daytime naps. ? While taking prescription pain medicines, sleeping medicines, or medicines that make you  drowsy.  Return to your normal activities as told by your health care provider. Ask your health care provider what activities are safe for you.  Take over-the-counter and prescription medicines only as told by your health care provider.  If you smoke, do not smoke without supervision.  Keep all follow-up visits as told by your health care provider. This is important. Contact a health care provider if:  You have nausea or vomiting that does not get better with medicine.  You cannot eat or drink without vomiting.  You have pain that does not get better with medicine.  You are unable to pass urine.  You develop a skin rash.  You have a fever.  You have redness around your IV site that gets worse. Get help right away if:  You have difficulty breathing.  You have chest pain.  You have blood in your urine or stool, or you vomit blood. Summary  After the procedure, it is common to have a sore throat or nausea. It is also  common to feel tired.  Have a responsible adult stay with you for the first 24 hours after general anesthesia. It is important to have someone help care for you until you are awake and alert.  When you feel hungry, start by eating small amounts of foods that are soft and easy to digest (bland), such as toast. Gradually return to your regular diet.  Drink enough fluid to keep your urine pale yellow.  Return to your normal activities as told by your health care provider. Ask your health care provider what activities are safe for you. This information is not intended to replace advice given to you by your health care provider. Make sure you discuss any questions you have with your health care provider. Document Released: 11/24/2000 Document Revised: 04/03/2017 Document Reviewed: 04/03/2017 Elsevier Interactive Patient Education  2019 [employer].

## 2018-11-18 NOTE — Progress Notes (Signed)
Pt has red noted bruise on right upper shoulder.

## 2018-11-18 NOTE — Op Note (Signed)
11/18/2018  10:23 AM  PATIENT:   Brady [name]  58 y.o. male  PRE-OPERATIVE DIAGNOSIS:  Displaced Right Proximal Humerous Fracture  POST-OPERATIVE DIAGNOSIS: Same  PROCEDURE: Open reduction and internal fixation of displaced right 2 part proximal humerus fracture  SURGEON:  Rayana Geurin, Vania Rea M.D.  ASSISTANTS: Ralene Bathe, PA-C  ANESTHESIA:   General endotracheal and interscalene block with Exparel  EBL: 200 cc  SPECIMEN: None  Drains: None   PATIENT DISPOSITION:  PACU - hemodynamically stable.    PLAN OF CARE: Admit for overnight observation  Brief history:  Patient is a 58 year old gentleman who fell down a flight of stairs sustaining a comminuted but minimally displaced left scapular body fracture as well as a severely displaced and angulated right 2 part proximal humerus fracture.  He is brought to the operating room this time for planned open reduction and internal fixation of his displaced right 2 part proximal humerus fracture.   Preoperatively I counseled the patient regarding treatment options as well as potential risks versus benefits thereof.  Possible surgical complications were reviewed including bleeding, infection, neurovascular injury, malunion, nonunion, loss of fixation, and possible need for additional surgery.  He understands and accepts and agrees with the planned procedure.  Procedure in detail:  After undergoing routine preop evaluation patient received prophylactic antibiotics and an interscalene block with Exparel was established in the holding area by the anesthesia department.  Patient was transferred to the operating room placed supine on the operating table underwent smooth induction of a general endotracheal anesthesia.  Placed into the beachchair position and appropriately padded and protected.  Fluoroscopic imaging was then used to confirm that the shoulder fracture could be properly visualized.  The right shoulder girdle region was then  sterilely prepped and draped in standard fashion.  Timeout was called.  An anterior deltopectoral approach to right shoulder was made through 12 cm incision.  Skin flaps elevated dissection carried deeply deltopectoral interval was then developed from proximal to distal and electrocautery was used for hemostasis.  The fracture site was then readily identified with significant anterior displacement of the shaft in relation to the humeral head and there was an oblique fracture pattern propagating the severe displacement.  A provisional reduction was accomplished utilizing a Crago elevator to shift the humeral head back over the shaft and then provisionally clamped a Biomet proximal humeral plate over the lateral cortex of the proximal humerus and also used and orthogonally placed bone clamp to maintain reduction at the fracture site.  Additional adjustments were made to the position of the plate and the fracture alignment using fluoroscopic imaging for guidance.  Once we were pleased with the overall alignment at the fracture site a provisional guidepin was then directed into the humeral head.  Positioning was confirmed on orthogonal fluoroscopic views and then a series of locking pegs were directed into the humeral head and additional fixation directed into the humeral shaft.  Once proper positioning of all hardware was then confirmed final fluoroscopic imaging was then used under live fluoroscopy to confirm all hardware was in good position and overall good alignment of the fracture site.  There was a significant metaphyseal bony defect which we filled with DBM allograft.  Final fluoroscopic images were then obtained.  Wound was copiously irrigated.  Hemostasis was obtained.  The deltopectoral interval was then reapproximated with a series of interrupted figure-of-eight #1 Vicryl sutures.  2-0 Monocryl used for the subcu layer and intracuticular 3-0 Monocryl for the skin followed by Dermabond  and Aquasol dressing.   Right arm was then placed in a sling.  Patient was awakened extubated and taken to the recovery room in stable condition.  Ralene Bathe, PA-C was used as an Geophysicist/field seismologist throughout this case essential for help with positioning of the patient, positioning of the extremity, manipulation of the fracture site, wound closure, and intraoperative decision-making.  Vania Rea Sean Malinowski MD   Contact # [phone]

## 2018-11-18 NOTE — Addendum Note (Signed)
Addendum  created 11/18/18 1322 by Lorelee Market, CRNA   Intraprocedure Meds edited

## 2018-11-18 NOTE — Progress Notes (Cosign Needed)
      Durable Medical Equipment  (From admission, onward)         Start     Ordered   11/18/18 0000  DME Other see comment    Comments:  Wheelchair   11/18/18 1100        non weight bearing needs wheelchair/Denell Cothern,BSN,RN3,CCM

## 2018-11-18 NOTE — Anesthesia Postprocedure Evaluation (Signed)
Anesthesia Post Note  Patient: Mamoun Spearman  Procedure(s) Performed: OPEN REDUCTION INTERNAL FIXATION (ORIF) PROXIMAL HUMERUS FRACTURE (Right Arm Upper)     Patient location during evaluation: PACU Anesthesia Type: General Level of consciousness: awake and alert Pain management: pain level controlled Vital Signs Assessment: post-procedure vital signs reviewed and stable Respiratory status: spontaneous breathing, nonlabored ventilation, respiratory function stable and patient connected to nasal cannula oxygen Cardiovascular status: blood pressure returned to baseline, stable and tachycardic Postop Assessment: no apparent nausea or vomiting Anesthetic complications: no    Last Vitals:  Vitals:   11/18/18 1130 11/18/18 1145  BP: 100/66 100/72  Pulse: (!) 106 (!) 105  Resp: 16 17  Temp:  37.1 C  SpO2: 93% 94%    Last Pain:  Vitals:   11/18/18 1130  TempSrc:   PainSc: Asleep                 Cecile Hearing

## 2018-11-18 NOTE — Progress Notes (Signed)
Assisted Dr. Turk with left, ultrasound guided, supraclavicular block. Side rails up, monitors on throughout procedure. See vital signs in flow sheet. Tolerated Procedure well. 

## 2018-11-18 NOTE — H&P (Signed)
Brady [name]    Chief Complaint: Displaced Right Proximal Humerous Fracture HPI: The patient is a 58 y.o. male s/p fall down flight of stairs sustaining comminuted but minimally displaced left scapular body fracture and significantly displaced and angulated right proximal humerus fracture   Past Medical History:  Diagnosis Date  . Anxiety   . Carotid artery stenosis   . Dyshydrosis   . Hyperlipidemia   . Hypertension     History reviewed. No pertinent surgical history.  History reviewed. No pertinent family history.  Social History:  reports that he has never smoked. He has never used smokeless tobacco. He reports previous alcohol use. He reports previous drug use.   Medications Prior to Admission  Medication Sig Dispense Refill  . aspirin 81 MG chewable tablet Chew 81 mg by mouth daily.     . cyclobenzaprine (FLEXERIL) 10 MG tablet Take 10 mg by mouth every 8 (eight) hours as needed for muscle spasms.    . DULoxetine (CYMBALTA) 60 MG capsule Take 1 capsule by mouth at bedtime.     Marland Kitchen lisinopril (PRINIVIL,ZESTRIL) 20 MG tablet Take 20 mg by mouth daily.     Marland Kitchen lovastatin (MEVACOR) 20 MG tablet Take 20 mg by mouth daily.     . Multiple Vitamin (MULTI-VITAMINS) TABS Take 1 tablet by mouth daily.     Marland Kitchen oxyCODONE-acetaminophen (PERCOCET/ROXICET) 5-325 MG tablet Take 1 tablet by mouth every 4 (four) hours as needed. 20 tablet 0  . traMADol (ULTRAM) 50 MG tablet Take 50 mg by mouth.       Physical Exam: A and O, Grossly N/V intact RUE, guarded and globally tender about shoulder, skin intact about R shoulder Xrays and CT show severely displaced R proximal humerus fracture. Vitals     Assessment/Plan  Impression: Displaced Right Proximal Humerous Fracture  Plan of Action: Procedure(s): OPEN REDUCTION INTERNAL FIXATION (ORIF) PROXIMAL HUMERUS FRACTURE  Brady [name] Brady [name] 11/18/2018, 6:09 AM Contact # [phone]

## 2018-11-18 NOTE — Addendum Note (Signed)
Addendum  created 11/18/18 1535 by Cecile Hearing, MD   Child order released for a procedure order, Clinical Note Signed, Image imported, Intraprocedure Blocks edited, Intraprocedure Meds edited

## 2018-11-18 NOTE — Anesthesia Procedure Notes (Signed)
Procedure Name: Intubation Date/Time: 11/18/2018 8:30 AM Performed by: Lorelee Market, CRNA Pre-anesthesia Checklist: Patient identified, Emergency Drugs available, Suction available, Patient being monitored and Timeout performed Patient Re-evaluated:Patient Re-evaluated prior to induction Oxygen Delivery Method: Circle system utilized Preoxygenation: Pre-oxygenation with 100% oxygen Induction Type: IV induction Ventilation: Mask ventilation without difficulty Laryngoscope Size: Glidescope and 4 Grade View: Grade I Tube type: Oral Tube size: 7.5 mm Number of attempts: 1 Airway Equipment and Method: Video-laryngoscopy Placement Confirmation: ETT inserted through vocal cords under direct vision,  positive ETCO2 and breath sounds checked- equal and bilateral Secured at: 24 cm Tube secured with: Tape Dental Injury: Teeth and Oropharynx as per pre-operative assessment

## 2018-11-18 NOTE — TOC Transition Note (Signed)
Transition of Care [facility]) - CM/SW Discharge Note   Patient Details  Name: Zyquarius Delaguila MRN: [id number] Date of Birth: 04-06-1961  [facility] CM/SW Contact:  Golda Acre, RN Phone Number: 11/18/2018, 10:59 AM   Clinical Narrative:     dme for a wheel chair only pt is in pacu  Final next level of care: Home/Self Care Barriers to Discharge: No Barriers Identified   Patient Goals and CMS Choice Patient states their goals for this hospitalization and ongoing recovery are:: n/a      Discharge Placement                       Discharge Plan and Services Discharge Planning Services: CM Consult Post Acute Care Choice: Durable Medical Equipment          DME Arranged: Wheelchair manual DME Agency: [employer]       Social Determinants of Health (SDOH) Interventions     Readmission Risk Interventions No flowsheet data found.

## 2018-11-18 NOTE — Anesthesia Preprocedure Evaluation (Addendum)
Anesthesia Evaluation  Patient identified by MRN, date of birth, ID band Patient awake    Reviewed: Allergy & Precautions, NPO status , Patient's Chart, lab work & pertinent test results  Airway Mallampati: II  TM Distance: >3 FB Neck ROM: Full    Dental  (+) Teeth Intact, Dental Advisory Given   Pulmonary neg pulmonary ROS,    Pulmonary exam normal breath sounds clear to auscultation       Cardiovascular hypertension, Pt. on medications Normal cardiovascular exam Rhythm:Regular Rate:Normal     Neuro/Psych PSYCHIATRIC DISORDERS Anxiety negative neurological ROS     GI/Hepatic negative GI ROS, Neg liver ROS,   Endo/Other  Obesity   Renal/GU negative Renal ROS     Musculoskeletal Displaced Right Proximal Humerous Fracture   Abdominal   Peds  Hematology negative hematology ROS (+)   Anesthesia Other Findings Day of surgery medications reviewed with the patient.  Reproductive/Obstetrics                            Anesthesia Physical Anesthesia Plan  ASA: II  Anesthesia Plan: General   Post-op Pain Management:  Regional for Post-op pain   Induction: Intravenous  PONV Risk Score and Plan: 2 and Midazolam, Dexamethasone and Ondansetron  Airway Management Planned: Oral ETT  Additional Equipment:   Intra-op Plan:   Post-operative Plan: Extubation in OR  Informed Consent: I have reviewed the patients History and Physical, chart, labs and discussed the procedure including the risks, benefits and alternatives for the proposed anesthesia with the patient or authorized representative who has indicated his/her understanding and acceptance.     Dental advisory given  Plan Discussed with: CRNA  Anesthesia Plan Comments:         Anesthesia Quick Evaluation

## 2018-11-18 NOTE — Anesthesia Procedure Notes (Signed)
Anesthesia Regional Block: Interscalene brachial plexus block   Pre-Anesthetic Checklist: ,, timeout performed, Correct Patient, Correct Site, Correct Laterality, Correct Procedure, Correct Position, site marked, Risks and benefits discussed,  Surgical consent,  Pre-op evaluation,  At surgeon's request and post-op pain management  Laterality: Right  Prep: chloraprep       Needles:  Injection technique: Single-shot  Needle Type: Echogenic Stimulator Needle     Needle Length: 5cm  Needle Gauge: 22     Additional Needles:   Procedures:,,,, ultrasound used (permanent image in chart),,,,  Narrative:  Start time: 11/18/2018 7:10 AM End time: 11/18/2018 7:15 AM Injection made incrementally with aspirations every 5 mL.  Performed by: Personally  Anesthesiologist: Cecile Hearing, MD  Additional Notes: Functioning IV was confirmed and monitors were applied.  A 37mm 22ga Arrow echogenic stimulator needle was used. Sterile prep and drape, hand hygiene, and sterile gloves were used.  Negative aspiration and negative test dose prior to incremental administration of local anesthetic. The patient tolerated the procedure well.  Ultrasound guidance: relevent anatomy identified, needle position confirmed, local anesthetic spread visualized around nerve(s), vascular puncture avoided.  Image printed for medical record.

## 2018-11-18 NOTE — Transfer of Care (Signed)
Immediate Anesthesia Transfer of Care Note  Patient: Brady [name]  Procedure(s) Performed: OPEN REDUCTION INTERNAL FIXATION (ORIF) PROXIMAL HUMERUS FRACTURE (Right Arm Upper)  Patient Location: PACU  Anesthesia Type:General  Level of Consciousness: awake, alert , oriented, drowsy and patient cooperative  Airway & Oxygen Therapy: Patient Spontanous Breathing and Patient connected to face mask oxygen  Post-op Assessment: Report given to RN, Post -op Vital signs reviewed and stable and Patient moving all extremities  Post vital signs: Reviewed and stable  Last Vitals:  Vitals Value Taken Time  BP 113/62 11/18/2018 10:45 AM  Temp 37.1 C 11/18/2018 10:45 AM  Pulse 101 11/18/2018 10:45 AM  Resp 14 11/18/2018 10:45 AM  SpO2 100 % 11/18/2018 10:45 AM    Last Pain:  Vitals:   11/18/18 0609  TempSrc: Oral         Complications: No apparent anesthesia complications

## 2018-11-19 ENCOUNTER — Encounter (HOSPITAL_COMMUNITY): Payer: Self-pay | Admitting: Orthopedic Surgery

## 2018-11-19 ENCOUNTER — Ambulatory Visit: Payer: Self-pay | Admitting: Orthopedic Surgery

## 2018-11-19 DIAGNOSIS — S42202A Unspecified fracture of upper end of left humerus, initial encounter for closed fracture: Secondary | ICD-10-CM | POA: Diagnosis not present

## 2018-11-19 NOTE — Progress Notes (Signed)
Physical Therapy Treatment Patient Details Name: Brady [name] MRN: [id number] DOB: [birth year] Today's Date: 11/19/2018    History of Present Illness s/p ORIF R due to R proximal humerus fx; L comminuted minimally displaced scapula body fx after a fall down stairs.  PMH:  HTN    PT Comments    Pt continues very cooperative and motivated to progress but ltd with ability to negotiate stairs 2* ++anxiety.  Pt  with several panic episodes while negotiating stairs affecting balance and pt ability to focus on task.  Pt currently at high risk for falling on stairs and injuring self and spouse.  Pt would benefit from PTAR transport to access home and follow up work with HHPT for balance, gait, strength and stair training.  Follow Up Recommendations  Home health PT     Equipment Recommendations  Other (comment)    Recommendations for Other Services OT consult     Precautions / Restrictions Precautions Precautions: Shoulder Type of Shoulder Precautions: sling on at all times x bathing/dressing and exercises.  Elbow to finger ROM allowed and PROM during ADLs within parameters of 10 ER, 45 ABD, and 60 FF Shoulder Interventions: Shoulder sling/immobilizer Restrictions Weight Bearing Restrictions: Yes Other Position/Activity Restrictions: NWB RUE    Mobility  Bed Mobility               General bed mobility comments: Pt up in chair and requests back to same  Transfers Overall transfer level: Needs assistance Equipment used: None Transfers: Sit to/from Stand Sit to Stand: Min guard         General transfer comment: min guard for confidence and to steady  Ambulation/Gait Ambulation/Gait assistance: Min assist;+2 safety/equipment Gait Distance (Feet): 44 Feet Assistive device: Quad cane Gait Pattern/deviations: Step-to pattern;Decreased step length - right;Decreased step length - left;Shuffle;Trunk flexed Gait velocity: decr   General Gait Details: Increased time,  general instability with min knee buckling; cues for sequence and basic safety awareness.  Pt states fees much more secure with QC vs SPC   Stairs Stairs: Yes Stairs assistance: Min assist;Mod assist;+2 physical assistance;+2 safety/equipment Stair Management: No rails Number of Stairs: 4 General stair comments: Cues for sequence and foot/QC placement; physical assist for balance; Pt extremely anxious and "flashing back on previous falls on stairs"   Wheelchair Mobility    Modified Rankin (Stroke Patients Only)       Balance Overall balance assessment: Needs assistance Sitting-balance support: No upper extremity supported;Feet supported Sitting balance-Leahy Scale: Good     Standing balance support: Single extremity supported Standing balance-Leahy Scale: Fair                              Cognition Arousal/Alertness: Awake/alert Behavior During Therapy: Anxious Overall Cognitive Status: Within Functional Limits for tasks assessed                                 General Comments: Improved clarity of thought and ability to process vs am session but elevated anxiety level      Exercises      General Comments        Pertinent Vitals/Pain Pain Assessment: Faces Faces Pain Scale: Hurts even more Pain Location: left scapula Pain Descriptors / Indicators: Aching;Sore Pain Intervention(s): Limited activity within patient's tolerance;Monitored during session;Premedicated before session;Ice applied    Home Living Family/patient expects to be discharged to:: Private residence  Living Arrangements: Spouse/significant other Available Help at Discharge: Family Type of Home: House Home Access: Stairs to enter Entrance Stairs-Rails: None Home Layout: One level Home Equipment: Gilmer Mor - single point      Prior Function Level of Independence: Independent      Comments: prior to fall on Monday   PT Goals (current goals can now be found in the care  plan section) Acute Rehab PT Goals Patient Stated Goal: none stated PT Goal Formulation: With patient Time For Goal Achievement: 12/03/18 Potential to Achieve Goals: Fair Progress towards PT goals: Progressing toward goals    Frequency    Min 3X/week      PT Plan Current plan remains appropriate    Co-evaluation PT/OT/SLP Co-Evaluation/Treatment: Yes Reason for Co-Treatment: For patient/therapist safety PT goals addressed during session: Mobility/safety with mobility OT goals addressed during session: ADL's and self-care      AM-PAC PT "6 Clicks" Mobility   Outcome Measure  Help needed turning from your back to your side while in a flat bed without using bedrails?: A Lot Help needed moving from lying on your back to sitting on the side of a flat bed without using bedrails?: A Lot Help needed moving to and from a bed to a chair (including a wheelchair)?: A Lot Help needed standing up from a chair using your arms (e.g., wheelchair or bedside chair)?: A Little Help needed to walk in hospital room?: A Little Help needed climbing 3-5 steps with a railing? : A Little 6 Click Score: 15    End of Session Equipment Utilized During Treatment: Gait belt Activity Tolerance: Patient tolerated treatment well;Other (comment)(limited by anxiety) Patient left: in chair;with call bell/phone within reach;with family/visitor present Nurse Communication: Mobility status PT Visit Diagnosis: Unsteadiness on feet (R26.81);Muscle weakness (generalized) (M62.81);Difficulty in walking, not elsewhere classified (R26.2);History of falling (Z91.81);Repeated falls (R29.6);Pain Pain - Right/Left: Left Pain - part of body: Shoulder     Time: 6045-4098 PT Time Calculation (min) (ACUTE ONLY): 35 min  Charges:  $Gait Training: 23-37 mins                     Mauro Kaufmann PT Acute Rehabilitation Services Pager [phone] Office [phone]    [facility] 11/19/2018, 4:38 PM

## 2018-11-19 NOTE — Progress Notes (Signed)
Patient left the unit with the transport personnel at 8:43 PM in stable condition. All discharge teaching was done.

## 2018-11-19 NOTE — Progress Notes (Signed)
Occupational Therapy Treatment Patient Details Name: Brady [name] MRN: [id number] DOB: [birth year] Today's Date: 11/19/2018    History of present illness s/p ORIF R due to R proximal humerus fx; L comminuted minimally displaced scapula body fx after a fall down stairs.  PMH:  HTN   OT comments  Seen with PT for mobility and continued with family education for adls and distal ROM.   Follow Up Recommendations  Supervision/Assistance - 24 hour    Equipment Recommendations  3 in 1 bedside commode    Recommendations for Other Services      Precautions / Restrictions Precautions Precautions: Shoulder Type of Shoulder Precautions: sling on at all times x bathing/dressing and exercises.  Elbow to finger ROM allowed and PROM during ADLs within parameters of 10 ER, 45 ABD, and 60 FF Shoulder Interventions: Shoulder sling/immobilizer Restrictions Weight Bearing Restrictions: Yes Other Position/Activity Restrictions: NWB RUE       Mobility Bed Mobility               General bed mobility comments: oob  Transfers Overall transfer level: Needs assistance Equipment used: (quad cane and hand held assist) Transfers: Sit to/from Stand Sit to Stand: Min assist;+2 safety/equipment         General transfer comment: assist to rise and steady    Balance                                           ADL either performed or assessed with clinical judgement   ADL                           Toilet Transfer: Minimal assistance;+2 for safety/equipment(quad cane;chair)             General ADL Comments: reviewed protocol with wife (and pt again).  Will work on donning a shirt later, to find easiest method.  Wife has been assisting pt at home and seesawing to wash.  Educated on leaning within established PROM parameters.  He has a recliner upstairs which someone may be able to move downstairs as he sleeps better in this.  Also, he has a walk in shower and  seat, but pt fatiques easily and recommended sponge bathing initially.  HHPT can further assess this/practice "dry run" with him. Reviewed use of gaitbelt  Shoulder protocol given     Vision       Perception     Praxis      Cognition Arousal/Alertness: Awake/alert Behavior During Therapy: WFL for tasks assessed/performed                                   General Comments: pt with slow processing; multiple cues needed        Exercises     Shoulder Instructions       General Comments      Pertinent Vitals/ Pain       Pain Assessment: Faces Faces Pain Scale: Hurts even more Pain Location: left scapula Pain Intervention(s): Limited activity within patient's tolerance;Monitored during session;Repositioned  Home Living  Prior Functioning/Environment              Frequency  Min 2X/week        Progress Toward Goals  OT Goals(current goals can now be found in the care plan section)  Progress towards OT goals: Progressing toward goals     Plan      Co-evaluation                 AM-PAC OT "6 Clicks" Daily Activity     Outcome Measure   Help from another person eating meals?: A Little Help from another person taking care of personal grooming?: A Lot Help from another person toileting, which includes using toliet, bedpan, or urinal?: A Lot Help from another person bathing (including washing, rinsing, drying)?: A Lot Help from another person to put on and taking off regular upper body clothing?: A Lot Help from another person to put on and taking off regular lower body clothing?: Total 6 Click Score: 12    End of Session    OT Visit Diagnosis: Pain;Muscle weakness (generalized) (M62.81) Pain - Right/Left: Left Pain - part of body: Shoulder   Activity Tolerance Patient tolerated treatment well   Patient Left in chair;with call bell/phone within reach;with family/visitor  present   Nurse Communication          Time: 0569-7948 OT Time Calculation (min): 46 min  Charges: OT General Charges $OT Visit: 1 Visit OT Treatments $Self Care/Home Management : 23-37 mins  Marica Otter, OTR/L Acute Rehabilitation Services [phone] [facility] pager [phone] office 11/19/2018   Brady [name] 11/19/2018, 12:41 PM

## 2018-11-19 NOTE — Evaluation (Signed)
Physical Therapy Evaluation Patient Details Name: Brady [name] MRN: [id number] DOB: Mar 07, 1961 Today's Date: 11/19/2018   History of Present Illness  s/p ORIF R due to R proximal humerus fx; L comminuted minimally displaced scapula body fx after a fall down stairs.  PMH:  HTN  Clinical Impression  [name], [name], [name]  [name]    Follow Up Recommendations Home health [name]    Equipment Recommendations  Other (comment)(QC large base)    Recommendations for Other Services OT consult     Precautions / Restrictions Precautions Precautions: Shoulder Type of Shoulder Precautions: sling on at all times x bathing/dressing and exercises.  Elbow to finger ROM allowed and PROM during ADLs within parameters of 10 ER, 45 ABD, and 60 FF Shoulder Interventions: Shoulder sling/immobilizer Restrictions Weight Bearing Restrictions: Yes Other Position/Activity Restrictions: NWB RUE      Mobility  Bed Mobility               General bed mobility comments: [name]  Transfers Overall transfer level: Needs assistance Equipment used: None Transfers: Sit to/from Stand Sit to Stand: Min assist;+2 safety/equipment         General transfer comment: assist to rise and steady  Ambulation/Gait Ambulation/Gait assistance: Min assist;+2 physical assistance;+2 safety/equipment Gait Distance (Feet): 26 Feet Assistive device: Quad cane Gait Pattern/deviations: Step-to pattern;Decreased step length - right;Decreased step length - left;Shuffle;Trunk flexed Gait velocity: decr   General Gait Details: Increased time, general instability with partial knee buckling; cues for sequence and basic safety awareness.  [name]  Stairs            Wheelchair Mobility    Modified Rankin (Stroke Patients Only)       Balance Overall balance assessment: Needs assistance Sitting-balance support: No upper extremity supported;Feet supported Sitting balance-Leahy Scale: Good     Standing balance support: Single extremity supported Standing balance-Leahy Scale: Fair                               Pertinent Vitals/Pain Pain Assessment: Faces Faces Pain Scale: Hurts even more Pain Location: left scapula Pain Intervention(s): Limited activity within patient's tolerance;Monitored during session;Premedicated before session    Home Living Family/patient expects to be discharged to:: Private residence Living Arrangements: Spouse/significant other Available Help at Discharge: Family Type of Home: House Home Access: Stairs to enter Entrance Stairs-Rails: None Entrance Stairs-Number of Steps: 2 Home Layout: One level Home Equipment: Cane - single point      Prior Function Level of Independence: Independent         Comments: prior to fall on Monday     Hand Dominance        Extremity/Trunk Assessment   Upper Extremity Assessment Upper Extremity Assessment: Defer to OT evaluation RUE Deficits / Details: immobilized but able to move elbow, wrist and fingers LUE Deficits / Details: removed sling and performed LUE elbow exercises; per RN report PA Brady [name] allowed to use for ADL and some WB on cane    Lower Extremity Assessment Lower Extremity Assessment: Generalized weakness       Communication   Communication: No difficulties  Cognition  Arousal/Alertness: Awake/alert Behavior During Therapy: WFL for tasks assessed/performed Overall Cognitive Status: Within Functional Limits for tasks assessed                                 General Comments: [name] with slow processing; multiple cues needed      General Comments      Exercises      Assessment/Plan    [name]  [name] Problem List         [name] Treatment Interventions DME instruction;Gait training;Stair training;Functional mobility training;Therapeutic activities;Therapeutic exercise;Balance training;Patient/family education    [name] Goals (Current goals can be found in the Care Plan section)  Acute Rehab [name] Goals Patient Stated Goal: none stated [name] Goal Formulation: With patient Time For Goal Achievement: 12/03/18 Potential to Achieve Goals: Fair    Frequency Min 3X/week   Barriers to discharge        Co-evaluation [name]/OT/SLP Co-Evaluation/Treatment: Yes Reason for Co-Treatment: For patient/therapist safety [name] goals addressed during session: Mobility/safety with mobility OT goals addressed during session: ADL's and self-care       AM-PAC [name] "6 Clicks" Mobility  Outcome Measure Help needed turning from your back to your side while in a flat bed without using bedrails?: A Lot Help needed moving from lying on your back to sitting on the side of a flat bed without using bedrails?: A Lot Help needed moving to and from a bed to a chair (including a wheelchair)?: A Lot Help needed standing up from a chair using your arms (e.g., wheelchair or bedside chair)?: A Lot Help needed to walk in hospital room?: A Little Help needed climbing 3-5 steps with a railing? : A Lot 6 Click Score: 13    End of Session Equipment Utilized During Treatment: Gait belt Activity Tolerance: Patient tolerated treatment well Patient left: in chair;with call bell/phone within reach;with family/visitor present Nurse Communication: Mobility status [name] Visit Diagnosis: Unsteadiness on feet (R26.81);Muscle weakness (generalized) (M62.81);Difficulty in walking, not elsewhere classified (R26.2);History of falling (Z91.81);Repeated falls (R29.6);Pain Pain - Right/Left: Left Pain - part of body: Shoulder    Time: 0240-9735 [name] Time Calculation (min)  (ACUTE ONLY): 28 min   Charges:   [name] Evaluation $[name] Eval Low Complexity: 1 Low          Brady [name] [name] Acute Rehabilitation Services Pager [phone] Office [phone]   Brady [name] 11/19/2018, 2:03 PM

## 2018-11-19 NOTE — Plan of Care (Signed)

## 2018-11-19 NOTE — Evaluation (Signed)
Occupational Therapy Evaluation Patient Details Name: Brady [name] MRN: [id number] DOB: Mar 14, 1961 Today's Date: 11/19/2018    History of Present Illness s/p ORIF R due to R proximal humerus fx; L comminuted minimally displaced scapula body fx after a fall down stairs.  PMH:  HTN   Clinical Impression   This 58 year old man was admitted for the above sx.  Pt had a fall on Monday and sustained the above injuries.  He was independent prior to this. Pt was very limited by pain, especially in L scapula at this time. Will follow in acute setting with the goals listed below.    Follow Up Recommendations  SNF;Supervision/Assistance - 24 hour;Follow surgeon's recommendation for DC plan and follow-up therapies(depending upon progress)    Equipment Recommendations  (? 3:1)    Recommendations for Other Services       Precautions / Restrictions Precautions Precautions: Shoulder, Fall Type of Shoulder Precautions: sling on at all times x bathing/dressing and exercises.  Elbow to finger ROM allowed and PROM during ADLs within parameters of 10 ER, 45 ABD, and 60 FF Shoulder Interventions: Shoulder sling/immobilizer Restrictions Weight Bearing Restrictions: No      Mobility Bed Mobility               General bed mobility comments: oob  Transfers                  NT    Balance                                           ADL either performed or assessed with clinical judgement   ADL Overall ADL's : Needs assistance/impaired Eating/Feeding: Minimal assistance Eating/Feeding Details (indicate cue type and reason): assist for positioning Grooming: Moderate assistance   Upper Body Bathing: Maximal assistance;Total assistance   Lower Body Bathing: Total assistance;+2 for physical assistance   Upper Body Dressing : Total assistance   Lower Body Dressing: Total assistance;+2 for physical assistance                 General ADL Comments: pt seen  for limited evaluation:  he was up in chair and needed assistance with positioning/adjusting sling and to use urinal. Reviewed allowed distal ROM and working within passive parameters for adls.  Could not lean pt forward fully for pillow behind him.  Very painful to move.  He was able to manage beverage with LUE when positioned directly in front of him.  Total A for urinal.       Vision         Perception     Praxis      Pertinent Vitals/Pain Pain Assessment: Faces Faces Pain Scale: Hurts whole lot Pain Location: left scapula Pain Intervention(s): Limited activity within patient's tolerance;Monitored during session;Repositioned     Hand Dominance     Extremity/Trunk Assessment Upper Extremity Assessment Upper Extremity Assessment: RUE deficits/detail;LUE deficits/detail RUE Deficits / Details: immobilized but able to move elbow, wrist and fingers LUE Deficits / Details: able to bring hand to mouth           Communication Communication Communication: No difficulties   Cognition Arousal/Alertness: Awake/alert Behavior During Therapy: WFL for tasks assessed/performed Overall Cognitive Status: Within Functional Limits for tasks assessed  General Comments       Exercises     Shoulder Instructions      Home Living Family/patient expects to be discharged to:: Private residence Living Arrangements: Spouse/significant other Available Help at Discharge: Family                                    Prior Functioning/Environment Level of Independence: Independent        Comments: prior to fall on Monday        OT Problem List: Decreased strength;Decreased range of motion;Decreased activity tolerance;Decreased knowledge of precautions;Pain;Impaired UE functional use(balance NT)      OT Treatment/Interventions: Self-care/ADL training;Therapeutic exercise;DME and/or AE instruction;Therapeutic  activities;Patient/family education(balance as needed)    OT Goals(Current goals can be found in the care plan section) Acute Rehab OT Goals Patient Stated Goal: none stated OT Goal Formulation: With patient Time For Goal Achievement: 12/03/18 Potential to Achieve Goals: Good ADL Goals Pt Will Transfer to Toilet: stand pivot transfer;bedside commode;with min assist Additional ADL Goal #1: pt will be compliant with PROM parameters during adls without cues and independent with elbow to wrist ROM  OT Frequency: Min 2X/week   Barriers to D/C:            Co-evaluation              AM-PAC OT "6 Clicks" Daily Activity     Outcome Measure Help from another person eating meals?: A Little Help from another person taking care of personal grooming?: A Lot Help from another person toileting, which includes using toliet, bedpan, or urinal?: Total Help from another person bathing (including washing, rinsing, drying)?: Total Help from another person to put on and taking off regular upper body clothing?: Total Help from another person to put on and taking off regular lower body clothing?: Total 6 Click Score: 9   End of Session    Activity Tolerance: Patient limited by pain Patient left: in chair;with call bell/phone within reach;with family/visitor present  OT Visit Diagnosis: Pain;Muscle weakness (generalized) (M62.81) Pain - Right/Left: Left Pain - part of body: Shoulder(scapula)                Time: 1610-9604 OT Time Calculation (min): 12 min Charges:  OT General Charges $OT Visit: 1 Visit OT Evaluation $OT Eval Low Complexity: 1 Low  Marica Otter, OTR/L Acute Rehabilitation Services [phone] [facility] pager [phone] office 11/19/2018  Jacelynn Hayton 11/19/2018, 8:23 AM

## 2018-11-19 NOTE — Progress Notes (Signed)
Brady [name]  MRN: [id number] DOB/Age: [birth year] 59 y.o. [facility] Procedure: Procedure(s) (LRB): OPEN REDUCTION INTERNAL FIXATION (ORIF) PROXIMAL HUMERUS FRACTURE (Right)     Subjective: Patient was up in chair. His pain is controlled. His discharge home yesterday was held because of difficulty with mobilization and his wife is unable to manage and assist his transfers. He has had multiple falls over the past weeks and states it is because his legs give way and had attributed it to chronic back condition.His preop CT scan of his head was negative  Vital Signs Temp:  [97.9 F (36.6 C)-100.2 F (37.9 C)] 98.3 F (36.8 C) (03/20 0506) Pulse Rate:  [77-115] 77 (03/20 0506) Resp:  [11-19] 14 (03/20 0506) BP: (98-152)/(62-92) 122/67 (03/20 0506) SpO2:  [93 %-100 %] 95 % (03/20 0506)  Lab Results No results for input(s): WBC, HGB, HCT, PLT in the last 72 hours. BMET No results for input(s): NA, K, CL, CO2, GLUCOSE, BUN, CREATININE, CALCIUM in the last 72 hours. No results found for: INR   Exam Right Upper extremity in sling NVI Scant pinpoint bloody drainage noted to aquacel        Plan We have had a long discussion with he and his wife regarding options. I ordered PT/OT and case manager to evaluate. They questioned possibilty of rehab but I do not think his injuries will meet criteria and any rehab facility would be a skilled stay.  While he is NWB to RUE and also has a left scapula body fx it is ok for him to use his left to assist himself, he is going to need education on mobility. I have asked case manager to see to assist with home health options.  We also discussed a neurology referral as an outpatient.  Plan will be for DC later today once eval of safety and all of home issues addressed if felt to be safe. Wasil Wolke PA-C  11/19/2018, 9:02 AM Contact # [phone]

## 2018-11-19 NOTE — Progress Notes (Signed)
   11/19/18 1500  OT Visit Information  Last OT Received On 11/19/18  Assistance Needed +2  History of Present Illness s/p ORIF R due to R proximal humerus fx; L comminuted minimally displaced scapula body fx after a fall down stairs.  PMH:  HTN  Precautions  Precautions Shoulder  Type of Shoulder Precautions sling on at all times x bathing/dressing and exercises.  Elbow to finger ROM allowed and PROM during ADLs within parameters of 10 ER, 45 ABD, and 60 FF  Shoulder Interventions Shoulder sling/immobilizer  Pain Assessment  Pain Assessment Faces  Faces Pain Scale 6  Pain Location left scapula  Pain Intervention(s) Limited activity within patient's tolerance;Monitored during session;Premedicated before session;Repositioned  Cognition  Arousal/Alertness Awake/alert  Behavior During Therapy WFL for tasks assessed/performed  General Comments still with some decreased processing but improved from morning  Upper Extremity Assessment  RUE Deficits / Details immobilized but able to move elbow, wrist and fingers.  Removed arm from sling to perform elbow ROM  LUE Deficits / Details very limited movement due to pain  ADL  General ADL Comments demonstrated putting an overhead shirt on pt, simulating with disposable gown. Pt's LUE is very painful and he cannot tolerate enough movement to accommode a button down shirt. Sent home in disposable gown and educated wife to cut L side and underneath side of sleeve and safety pin together if he doesn't tolerate very large shirt with sequence taught.  Modified handout. Wife donned sling with supervision and verbalizes comfort with this  Restrictions  Other Position/Activity Restrictions NWB RUE  OT - End of Session  Activity Tolerance Patient tolerated treatment well  Patient left in chair;with call bell/phone within reach;with family/visitor present  OT Assessment/Plan  OT Visit Diagnosis Pain;Muscle weakness (generalized) (M62.81)  Pain - Right/Left Left   Pain - part of body Shoulder  Follow Up Recommendations Supervision/Assistance - 24 hour  OT Equipment 3 in 1 bedside commode  AM-PAC OT "6 Clicks" Daily Activity Outcome Measure (Version 2)  Help from another person eating meals? 3  Help from another person taking care of personal grooming? 2  Help from another person toileting, which includes using toliet, bedpan, or urinal? 2  Help from another person bathing (including washing, rinsing, drying)? 2  Help from another person to put on and taking off regular upper body clothing? 2  Help from another person to put on and taking off regular lower body clothing? 1  6 Click Score 12  OT Goal Progression  Progress towards OT goals Progressing toward goals  OT Time Calculation  OT Start Time (ACUTE ONLY) 1450  OT Stop Time (ACUTE ONLY) 1513  OT Time Calculation (min) 23 min  OT General Charges  $OT Visit 1 Visit  OT Treatments  $Self Care/Home Management  8-22 mins  Marica Otter, OTR/L Acute Rehabilitation Services [phone] [facility] pager [phone] office 11/19/2018

## 2018-11-19 NOTE — TOC Initial Note (Signed)
Transition of Care [facility]) - Initial/Assessment Note    Patient Details  Name: Brady [name] MRN: [id number] Date of Birth: 06/18/1961  Transition of Care [facility]) CM/SW Contact:    Bartholome Bill, RN Phone Number: 11/19/2018, 3:31 PM  Clinical Narrative:                 Spoke with pt and wife at bedside. Wife request quad cane and 3in1. Home Health set up with [facility] ([facility])  Expected Discharge Plan: Home w Home Health Services Barriers to Discharge: No Barriers Identified   Patient Goals and CMS Choice Patient states their goals for this hospitalization and ongoing recovery are:: n/a   Choice offered to / list presented to : Patient, Spouse  Expected Discharge Plan and Services Expected Discharge Plan: Home w Home Health Services   Discharge Planning Services: CM Consult Post Acute Care Choice: Home Health Living arrangements for the past 2 months: Single Family Home Expected Discharge Date: 11/18/18               DME Arranged: Gilmer Mor, 3-N-1 DME Agency: [facility] HH Arranged: PT, OT, Nurse's Aide HH Agency: [facility] ([facility])  Prior Living Arrangements/Services Living arrangements for the past 2 months: Single Family Home Lives with:: Spouse Patient language and need for interpreter reviewed:: Yes Do you feel safe going back to the place where you live?: Yes      Need for Family Participation in Patient Care: Yes (Comment) Care giver support system in place?: Yes (comment)   Criminal Activity/Legal Involvement Pertinent to Current Situation/Hospitalization: No - Comment as needed  Activities of Daily Living Home Assistive Devices/Equipment: Shower chair with back ADL Screening (condition at time of admission) Patient's cognitive ability adequate to safely complete daily activities?: Yes Is the patient deaf or have difficulty hearing?: No Does the patient have difficulty seeing, even when wearing glasses/contacts?: No Does the patient have difficulty  concentrating, remembering, or making decisions?: No Patient able to express need for assistance with ADLs?: Yes Does the patient have difficulty dressing or bathing?: Yes Independently performs ADLs?: No Communication: Independent Dressing (OT): Needs assistance Is this a change from baseline?: Pre-admission baseline Grooming: Needs assistance Is this a change from baseline?: Pre-admission baseline Feeding: Needs assistance Bathing: Needs assistance Toileting: Needs assistance In/Out Bed: Needs assistance Walks in Home: Needs assistance Does the patient have difficulty walking or climbing stairs?: Yes Weakness of Legs: None Weakness of Arms/Hands: None  Permission Sought/Granted Permission sought to share information with : Facility Industrial/product designer granted to share information with : Yes, Verbal Permission Granted     Permission granted to share info w AGENCY: [facility]        Emotional Assessment Appearance:: Appears stated age Attitude/Demeanor/Rapport: Apprehensive Affect (typically observed): Anxious Orientation: : Oriented to Self, Oriented to Place, Oriented to  Time, Oriented to Situation Alcohol / Substance Use: Not Applicable Psych Involvement: No (comment)  Admission diagnosis:  Proximal humerus fracture [S42.209A] Patient Active Problem List   Diagnosis Date Noted  . Proximal humerus fracture 11/18/2018  . Other insomnia 10/23/2016  . Anxiety disorder 10/08/2015  . Carotid artery stenosis, asymptomatic 10/08/2015  . Dyshydrosis 10/08/2015  . Essential hypertension 10/08/2015  . Hyperlipidemia LDL goal <100 10/08/2015  . Obesity (BMI 30-39.9) 10/08/2015  . Screening for prostate cancer 10/08/2015   PCP:  Loyal Jacobson, MD Pharmacy:   [facility] [address], [place] - [address] [place] [address] Phone: [phone] Fax: [phone]  Social Determinants of Health (SDOH)  Interventions    Readmission Risk Interventions No flowsheet data found.

## 2018-11-30 NOTE — Discharge Summary (Signed)
PATIENT ID:      Brady [name]  MRN:     [id number] DOB/AGE:    November 15, 1960 / 58 y.o.     DISCHARGE SUMMARY  ADMISSION DATE:    11/18/2018 DISCHARGE DATE:  11/19/2018  ADMISSION DIAGNOSIS: Displaced Right Proximal Humerous Fracture Past Medical History:  Diagnosis Date  . Anxiety   . Carotid artery stenosis   . Dyshydrosis   . Hyperlipidemia   . Hypertension     DISCHARGE DIAGNOSIS:   Active Problems:   Proximal humerus fracture   PROCEDURE: Procedure(s): OPEN REDUCTION INTERNAL FIXATION (ORIF) PROXIMAL HUMERUS FRACTURE on 11/18/2018  CONSULTS:    HISTORY:  See H&P in chart.  HOSPITAL COURSE:  Brady [name] is a 57 y.o. admitted on 11/18/2018 with a diagnosis of Displaced Right Proximal Humerous Fracture.  They were brought to the operating room on 11/18/2018 and underwent Procedure(s): OPEN REDUCTION INTERNAL FIXATION (ORIF) PROXIMAL HUMERUS FRACTURE.    They were given perioperative antibiotics:  Anti-infectives (From admission, onward)   Start     Dose/Rate Route Frequency Ordered Stop   11/18/18 0615  ceFAZolin (ANCEF) IVPB 2g/100 mL premix     2 g 200 mL/hr over 30 Minutes Intravenous On call to O.R. 11/18/18 7829 11/18/18 5621    .  Patient underwent the above named procedure and tolerated it well. The following day they were hemodynamically stable and pain was controlled on oral analgesics. They were neurovascularly intact to the operative extremity. OT was ordered and worked with patient per protocol.He was requiring additional therapy interventions to ensure a safe discharge home as he had multiple falls leading up to this admission. We did also add home health services to assist patient and his wife at home.They were medically and orthopaedically stable for discharge on 11/19/2018.    DIAGNOSTIC STUDIES:  RECENT RADIOGRAPHIC STUDIES :  Dg Chest 1 View  Result Date: 11/16/2018 CLINICAL DATA:  Fall down steps. EXAM: CHEST  1 VIEW COMPARISON:  None. FINDINGS:  Heart and mediastinal contours are within normal limits. No focal opacities or effusions. No acute bony abnormality. IMPRESSION: No active disease. Electronically Signed   By: Charlett Nose M.D.   On: 11/16/2018 00:17   Dg Scapula Left  Result Date: 11/16/2018 CLINICAL DATA:  Fall down steps.  Left scapular pain. EXAM: LEFT SCAPULA - 2+ VIEWS COMPARISON:  Left shoulder series earlier today. FINDINGS: Displaced fractures are noted through the left scapula. Fracture noted near the base of the acromion. No subluxation or dislocation of the left shoulder. IMPRESSION: Displaced scapular body fractures and nondisplaced fracture near the base of the acromion. Electronically Signed   By: Charlett Nose M.D.   On: 11/16/2018 01:29   Dg Shoulder 1v Right  Result Date: 11/18/2018 CLINICAL DATA:  Right shoulder ORIF EXAM: DG C-ARM 61-120 MIN; RIGHT SHOULDER - 1 VIEW COMPARISON:  Radiography from 3 days ago FINDINGS: Three intraprocedural fluoroscopic images show plating of a humeral neck fracture. No screw penetration into the joint space which is located. IMPRESSION: Fluoroscopy for proximal humerus ORIF. Electronically Signed   By: Marnee Spring M.D.   On: 11/18/2018 10:34   Dg Shoulder Right  Result Date: 11/16/2018 CLINICAL DATA:  Fall down steps.  Pain. EXAM: RIGHT SHOULDER - 2+ VIEW COMPARISON:  None. FINDINGS: Fracture noted through the right humeral neck and proximal shaft. No subluxation or dislocation. Minimal displacement. IMPRESSION: Minimally displaced fracture through the right humeral neck/proximal shaft. Electronically Signed   By: Charlett Nose M.D.  On: 11/16/2018 00:18   Ct Head Wo Contrast  Result Date: 11/16/2018 CLINICAL DATA:  Fall down stairs. EXAM: CT HEAD WITHOUT CONTRAST CT CERVICAL SPINE WITHOUT CONTRAST TECHNIQUE: Multidetector CT imaging of the head and cervical spine was performed following the standard protocol without intravenous contrast. Multiplanar CT image reconstructions of  the cervical spine were also generated. COMPARISON:  None. FINDINGS: CT HEAD FINDINGS Brain: Mild age related volume loss. No acute intracranial abnormality. Specifically, no hemorrhage, hydrocephalus, mass lesion, acute infarction, or significant intracranial injury. Vascular: No hyperdense vessel or unexpected calcification. Skull: No acute calvarial abnormality. Sinuses/Orbits: Visualized paranasal sinuses and mastoids clear. Orbital soft tissues unremarkable. Other: None CT CERVICAL SPINE FINDINGS Alignment: No subluxation Skull base and vertebrae: No acute fracture. No primary bone lesion or focal pathologic process. Soft tissues and spinal canal: No prevertebral fluid or swelling. No visible canal hematoma. Disc levels:  Diffuse degenerative disc and facet disease. Upper chest: No acute findings Other: None IMPRESSION: No acute intracranial abnormality. No acute bony abnormality in the cervical spine. Electronically Signed   By: Charlett Nose M.D.   On: 11/16/2018 00:15   Ct Cervical Spine Wo Contrast  Result Date: 11/16/2018 CLINICAL DATA:  Fall down stairs. EXAM: CT HEAD WITHOUT CONTRAST CT CERVICAL SPINE WITHOUT CONTRAST TECHNIQUE: Multidetector CT imaging of the head and cervical spine was performed following the standard protocol without intravenous contrast. Multiplanar CT image reconstructions of the cervical spine were also generated. COMPARISON:  None. FINDINGS: CT HEAD FINDINGS Brain: Mild age related volume loss. No acute intracranial abnormality. Specifically, no hemorrhage, hydrocephalus, mass lesion, acute infarction, or significant intracranial injury. Vascular: No hyperdense vessel or unexpected calcification. Skull: No acute calvarial abnormality. Sinuses/Orbits: Visualized paranasal sinuses and mastoids clear. Orbital soft tissues unremarkable. Other: None CT CERVICAL SPINE FINDINGS Alignment: No subluxation Skull base and vertebrae: No acute fracture. No primary bone lesion or focal  pathologic process. Soft tissues and spinal canal: No prevertebral fluid or swelling. No visible canal hematoma. Disc levels:  Diffuse degenerative disc and facet disease. Upper chest: No acute findings Other: None IMPRESSION: No acute intracranial abnormality. No acute bony abnormality in the cervical spine. Electronically Signed   By: Charlett Nose M.D.   On: 11/16/2018 00:15   Dg Shoulder Left  Result Date: 11/16/2018 CLINICAL DATA:  The patient fell backwards and down about 15 steps today. He is now having severe bilateral shoulder pain and no mobility to them. EXAM: LEFT SHOULDER - 2+ VIEW COMPARISON:  None. FINDINGS: No fracture. Glenohumeral and AC joints are normally spaced and aligned. No arthropathic changes. Scapula is not well assessed on this study. If there is pain over the scapula, recommend dedicated scapular radiographs for further assessment. Soft tissues are unremarkable. IMPRESSION: No fracture or dislocation. Electronically Signed   By: Amie Portland M.D.   On: 11/16/2018 00:19   Dg C-arm 1-60 Min  Result Date: 11/18/2018 CLINICAL DATA:  Right shoulder ORIF EXAM: DG C-ARM 61-120 MIN; RIGHT SHOULDER - 1 VIEW COMPARISON:  Radiography from 3 days ago FINDINGS: Three intraprocedural fluoroscopic images show plating of a humeral neck fracture. No screw penetration into the joint space which is located. IMPRESSION: Fluoroscopy for proximal humerus ORIF. Electronically Signed   By: Marnee Spring M.D.   On: 11/18/2018 10:34   Ct Angio Chest Aorta W And/or Wo Contrast  Result Date: 11/16/2018 CLINICAL DATA:  Fall down steps. EXAM: CT ANGIOGRAPHY CHEST WITH CONTRAST TECHNIQUE: Multidetector CT imaging of the chest was performed using the  standard protocol during bolus administration of intravenous contrast. Multiplanar CT image reconstructions and MIPs were obtained to evaluate the vascular anatomy. CONTRAST:  OMNIPAQUE IOHEXOL 350 MG/ML SOLN COMPARISON:  None. FINDINGS: Cardiovascular:  Heart is normal size. Aorta is normal caliber. No evidence of aortic dissection or injury. Mediastinum/Nodes: No mediastinal, hilar, or axillary adenopathy. No evidence of mediastinal hematoma. Lungs/Pleura: Lungs are clear. No focal airspace opacities or suspicious nodules. No effusions. No pneumothorax. Upper Abdomen: Imaging into the upper abdomen shows no acute findings. Musculoskeletal: Chest wall soft tissues are unremarkable. Comminuted fracture noted through the left scapula. Fracture through the proximal right humerus. No visible rib fracture. Normal alignment in the thoracic spine with no fracture. Review of the MIP images confirms the above findings. IMPRESSION: No evidence of aortic aneurysm, dissection or injury. Comminuted left scapular fracture.  Proximal right humeral fracture. No acute cardiopulmonary disease. Electronically Signed   By: Charlett Nose M.D.   On: 11/16/2018 02:48    RECENT VITAL SIGNS:  No data found.Marland Kitchen  RECENT EKG RESULTS:    Orders placed or performed during the hospital encounter of 06/20/18  . ED EKG  . ED EKG  . EKG 12-Lead  . EKG 12-Lead  . EKG    DISCHARGE INSTRUCTIONS:  Discharge Instructions    Call MD / Call 911   Complete by:  As directed    If you experience chest pain or shortness of breath, CALL 911 and be transported to the hospital emergency room.  If you develope a fever above 101 F, pus (white drainage) or increased drainage or redness at the wound, or calf pain, call your surgeon's office.   Constipation Prevention   Complete by:  As directed    Drink plenty of fluids.  Prune juice may be helpful.  You may use a stool softener, such as Colace (over the counter) 100 mg twice a day.  Use MiraLax (over the counter) for constipation as needed.   DME Other see comment   Complete by:  As directed    Wheelchair   Diet - low sodium heart healthy   Complete by:  As directed    Discharge instructions   Complete by:  As directed    Non weight  bearing right upper extremity   Discharge patient   Complete by:  As directed    Discharge disposition:  01-Home or Self Care   Discharge patient date:  11/18/2018   Driving restrictions   Complete by:  As directed    No driving for 12 weeks   Increase activity slowly as tolerated   Complete by:  As directed    Lifting restrictions   Complete by:  As directed    No lifting for 12 weeks   Place in observation (patient's expected length of stay will be less than 2 midnights)   Complete by:  As directed       DISCHARGE MEDICATIONS:   Allergies as of 11/19/2018   No Known Allergies     Medication List    TAKE these medications   aspirin 81 MG chewable tablet Chew 81 mg by mouth daily.   cyclobenzaprine 10 MG tablet Commonly known as:  FLEXERIL Take 10 mg by mouth every 8 (eight) hours as needed for muscle spasms.   DULoxetine 60 MG capsule Commonly known as:  CYMBALTA Take 1 capsule by mouth at bedtime.   lisinopril 20 MG tablet Commonly known as:  PRINIVIL,ZESTRIL Take 20 mg by mouth daily.  lovastatin 20 MG tablet Commonly known as:  MEVACOR Take 20 mg by mouth daily.   Multi-Vitamins Tabs Take 1 tablet by mouth daily.   ondansetron 4 MG tablet Commonly known as:  Zofran Take 1 tablet (4 mg total) by mouth every 8 (eight) hours as needed for nausea or vomiting.   oxyCODONE-acetaminophen 5-325 MG tablet Commonly known as:  PERCOCET/ROXICET Take 1 tablet by mouth every 4 (four) hours as needed. What changed:  Another medication with the same name was added. Make sure you understand how and when to take each.   oxyCODONE-acetaminophen 5-325 MG tablet Commonly known as:  Percocet Take 1 tablet by mouth every 4 (four) hours as needed (max 6 q). What changed:  You were already taking a medication with the same name, and this prescription was added. Make sure you understand how and when to take each.   traMADol 50 MG tablet Commonly known as:  ULTRAM Take 50 mg  by mouth.            Durable Medical Equipment  (From admission, onward)         Start     Ordered   11/18/18 0000  DME Other see comment    Comments:  Wheelchair   11/18/18 1100          FOLLOW UP VISIT:   Follow-up Information    Francena Hanly, MD.   Specialty:  Orthopedic Surgery Why:  call to be seen in 10-14 days Contact information: [address] [place] [address] [phone]        [facility] Follow up.   Why:  New name [facility]          DISCHARGE TO: Home   DISCHARGE CONDITION:  Rodolph Bong for Dr. Francena Hanly 11/30/2018, 6:41 PM

## 2019-04-06 ENCOUNTER — Telehealth: Payer: Self-pay

## 2019-04-06 ENCOUNTER — Other Ambulatory Visit: Payer: Self-pay

## 2019-04-06 ENCOUNTER — Ambulatory Visit (INDEPENDENT_AMBULATORY_CARE_PROVIDER_SITE_OTHER): Payer: BC Managed Care – PPO | Admitting: Neurology

## 2019-04-06 ENCOUNTER — Encounter: Payer: Self-pay | Admitting: Neurology

## 2019-04-06 VITALS — BP 115/80 | HR 85 | Ht 69.0 in | Wt 239.0 lb

## 2019-04-06 DIAGNOSIS — W19XXXS Unspecified fall, sequela: Secondary | ICD-10-CM

## 2019-04-06 DIAGNOSIS — G20 Parkinson's disease: Secondary | ICD-10-CM | POA: Diagnosis not present

## 2019-04-06 NOTE — Telephone Encounter (Signed)
Pt signed consent for insurance auth for DaT scan.

## 2019-04-06 NOTE — Progress Notes (Signed)
Subjective:    Patient ID: Brady [name] is a 58 y.o. male.  HPI     Star Age, MD, PhD [facility] [address] [place], [place] [address]  Dear Dr. Jeralene Huff,   I saw your patient, Brady [name], upon your kind request in my neurologic clinic today for initial consultation of his tremor and changes in his gait. The patient is accompanied by his wife today.  As you know, Brady [name] is a 58 year old left-handed gentleman with an underlying medical history of hypertension, hyperlipidemia, anxiety, carotid artery stenosis, and obesity, who reports problems with his balance that started in 2018.  He had a fall in December and injured his right scapula at the time.  He was having injections into his lower back secondary to right-sided sciatica.  He had noticed over time changes in his gait.  His wife noticed changes in his posture and gait in 2018.  In March 2020 he fell and injured his left scapula and had a fractured right humerus which needed surgery.  He has overtime noticed shorter steps, changes in his handwriting, lack of energy.  He does not sleep very well at night.  He reports that his father passed away in 2018/07/18 with dementia, he lived to be 59 years old.  His paternal aunt reportedly had Parkinson's disease.   He lives with his wife, they have no children.  They have no pets in the house.  He works as a Scientist, physiological. I reviewed your office note from 03/09/2019.  He has noted a tremor in his left hand and leg since about June 2020.  He has noticed it intermittently, he has noticed shuffling of his gait and stutter steps.  His anxiety had increased.  Of note, he is on alprazolam 0.25 mg twice daily as needed, he is also on BuSpar 10 mg twice daily, Cymbalta 60 mg daily.  He had a recent thyroid function test in your office.  We will request test results from your office. He had a cervical spine CT without contrast as well as  head CT without contrast on 11/16/2018 with indication of fall and I reviewed the results: IMPRESSION: No acute intracranial abnormality. No acute bony abnormality in the cervical spine. He is a non-smoker, he does not currently drink any alcohol, drinks caffeine in the form of soda, about 16 ounce every other day or so.  He has had constipation, in fact, in October 2019 he had to go to the emergency room with obstipation, requiring disimpaction.  His Past Medical History Is Significant For: Past Medical History:  Diagnosis Date  . Anxiety   . Carotid artery stenosis   . Dyshydrosis   . Hyperlipidemia   . Hypertension     His Past Surgical History Is Significant For: Past Surgical History:  Procedure Laterality Date  . ORIF HUMERUS FRACTURE Right 11/18/2018   Procedure: OPEN REDUCTION INTERNAL FIXATION (ORIF) PROXIMAL HUMERUS FRACTURE;  Surgeon: Justice Britain, MD;  Location: [facility];  Service: Orthopedics;  Laterality: Right;    His Family History Is Significant For: No family history on file.  His Social History Is Significant For: Social History   Socioeconomic History  . Marital status: Married    Spouse name: Not on file  . Number of children: Not on file  . Years of education: Not on file  . Highest education level: Not on file  Occupational History  . Not on file  Social Needs  .  Financial resource strain: Not on file  . Food insecurity    Worry: Not on file    Inability: Not on file  . Transportation needs    Medical: Not on file    Non-medical: Not on file  Tobacco Use  . Smoking status: Never Smoker  . Smokeless tobacco: Never Used  Substance and Sexual Activity  . Alcohol use: Not Currently  . Drug use: Not Currently  . Sexual activity: Not on file  Lifestyle  . Physical activity    Days per week: Not on file    Minutes per session: Not on file  . Stress: Not on file  Relationships  . Social Musicianconnections    Talks on phone: Not on file    Gets together:  Not on file    Attends religious service: Not on file    Active member of club or organization: Not on file    Attends meetings of clubs or organizations: Not on file    Relationship status: Not on file  Other Topics Concern  . Not on file  Social History Narrative  . Not on file    His Allergies Are:  No Known Allergies:   His Current Medications Are:  Outpatient Encounter Medications as of 04/06/2019  Medication Sig  . ALPRAZolam (XANAX) 0.25 MG tablet Take 0.25 mg by mouth at bedtime as needed for anxiety.  Marland Kitchen. aspirin 81 MG chewable tablet Chew 81 mg by mouth daily.   . busPIRone (BUSPAR) 10 MG tablet Take 10 mg by mouth.  . DULoxetine (CYMBALTA) 60 MG capsule Take 1 capsule by mouth at bedtime.   Marland Kitchen. lisinopril (PRINIVIL,ZESTRIL) 20 MG tablet Take 20 mg by mouth daily.   Marland Kitchen. lovastatin (MEVACOR) 20 MG tablet Take 20 mg by mouth daily.   . Multiple Vitamin (MULTI-VITAMINS) TABS Take 1 tablet by mouth daily.   . [DISCONTINUED] cyclobenzaprine (FLEXERIL) 10 MG tablet Take 10 mg by mouth every 8 (eight) hours as needed for muscle spasms.  . [DISCONTINUED] ondansetron (ZOFRAN) 4 MG tablet Take 1 tablet (4 mg total) by mouth every 8 (eight) hours as needed for nausea or vomiting.  . [DISCONTINUED] oxyCODONE-acetaminophen (PERCOCET) 5-325 MG tablet Take 1 tablet by mouth every 4 (four) hours as needed (max 6 q).  . [DISCONTINUED] oxyCODONE-acetaminophen (PERCOCET/ROXICET) 5-325 MG tablet Take 1 tablet by mouth every 4 (four) hours as needed.  . [DISCONTINUED] traMADol (ULTRAM) 50 MG tablet Take 50 mg by mouth.   No facility-administered encounter medications on file as of 04/06/2019.   : Review of Systems:  Out of a complete 14 point review of systems, all are reviewed and negative with the exception of these symptoms as listed below:  Review of Systems  Neurological:       Pt presents today to discuss his shuffling gait and "baby steps". He complains of a tremor on his left side. He is left  handed. Pt is complaining of smaller hand-writing.    Objective:  Neurological Exam  Physical Exam Physical Examination:   Vitals:   04/06/19 1430  BP: 115/80  Pulse: 85   General Examination: The patient is a very pleasant 58 y.o. male in no acute distress.  HEENT: Normocephalic, atraumatic, pupils are equal, round and reactive to light and accommodation. He wears corrective eyeglasses.  Extraocular tracking shows mild saccadic breakdown, he has slight limitation to upgaze.  He has no nystagmus.  He has moderate facial masking.  He has no obvious sialorrhea.  Hearing is grossly  intact.  He has moderate to significant nuchal rigidity.  He has no lip, neck or jaw tremor.  He has mild hypophonia, no obvious dysarthria, airway examination with dryness, moderate airway crowding noted.  Chest: is clear to auscultation without wheezing, rhonchi or crackles noted.  Heart: sounds are regular and normal without murmurs, rubs or gallops noted.   Abdomen: is soft, non-tender and non-distended with normal bowel sounds appreciated on auscultation.  Extremities: There is no pitting edema in the distal lower extremities bilaterally.   Skin: is warm and dry with no trophic changes noted.    Musculoskeletal: exam reveals Decreased range of motion in both shoulders.  Neurologically:  Mental status: The patient is awake and alert, paying good  attention. He is able to provide the history. His wife provides details. He is oriented to: person, place, time/date, situation, day of week, month of year and year. His memory, attention, language and knowledge are intact. There is no aphasia, agnosia, apraxia or anomia. There is a mild degree of bradyphrenia. Speech is mildly hypophonic with no dysarthria noted. Mood is congruent and affect is constricted.  On 04/06/2019: On Archimedes spiral drawing he has no significant trembling with the left hand which is his dominant hand, slight insecurity with the right  hand, handwriting with the left hand is legible, slightly tremulous, not particularly micrographic. Cranial nerves are as described above under HEENT exam. In addition, shoulder shrug is normal with equal shoulder height noted.  Motor exam: Normal bulk, and strength for age is noted.  Tone is moderately rigid with presence of cogwheeling in the Upper extremities bilaterally.  He has moderate bradykinesia.  He has an intermittent resting tremor in the left upper and left lower extremities, no obvious resting tremor in the right upper and lower extremities.  He has no significant postural or action tremor.  He has no intention tremor.  Fine motor skills are impaired in the moderate range in the upper and lower extremities bilaterally, no telltale lateralization noted.   Reflexes are 1+ in the upper extremities and 1+ in the lower extremities.   Cerebellar testing shows no dysmetria or intention tremor on finger to nose testing.   Sensory exam is intact to light touch.   Gait, station and balance: He stands up from the seated position with mild difficulty and does not need to push up with His hands. He needs no assistance. No veering to one side is noted. He is not noted to lean to one side. He stands slightly wide-based, posture is stooped for age.  He walks with decreased stride length and decreased pace and slight difficulty with turns.  He has decreased arm swing bilaterally.  Assessment and Plan:   In summary, Brady [name] is a very pleasant 58 y.o.-year old male with an underlying medical history of hypertension, hyperlipidemia, anxiety, carotid artery stenosis, and obesity, who Presents for evaluation of his tremors, gait disorder, history of falls, and changes overall with his fine motor skills.  He has had falls with injuries unfortunately.  His history and examination are concerning for parkinsonism.  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  PSP with history of falling backwards but also MSA would be in the differential diagnosis.  He has had significant constipation last year. I had an extended Publix[employer]versation  with the patient and his wife regarding his symptoms, my findings and the likely diagnosis of parkinsonism versus Parkinson's disease. We talked about symptomatic treatments and nonpharmacological treatment and The importance of healthy lifestyle, fall prevention.  He has been using a cane on the left side.  He has had physical therapy for his back and shoulder.  As far as further diagnostic testing is concerned, I Would like to proceed with a DaTscan.  I explained this nuclear medicine SPECT scan to the patient And his wife in detail.  Written consent was obtained today.  As far as medications are concerned, I recommended the following at this time: I suggested no immediate change today.  He is advised to talk to you about ongoing management of his anxiety which has flared up.  I cautioned him regarding the use of Xanax which can be addicting and impair balance. He has been using this cautiously. I would like to make a referral to neuro rehab for evaluation with PT and OT and ongoing treatment if possible.  We may consider symptomatic medication with Sinemet in the near future.  I would like to reconvene after his DaTscan is obtained. We will keep him posted as to the test results. I answered all their questions today and the patient and his wife were in agreement with the above outlined plan.  Thank you very much for allowing me to participate in the care of this nice patient. If I can be of any further assistance to you please do not hesitate to call me at [phone].  Sincerely,   Huston FoleySaima Key Cen, MD, PhD

## 2019-04-06 NOTE — Patient Instructions (Addendum)
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   You may have some symptoms that can mimic parkinson's disease. This can affect your balance, your memory, your mood, your bowel and bladder function, your posture, balance and walking and your activities of daily living. However, we can consider supportive treatments and even medication down the road for symptomatic treatment.   I do want to suggest a few things today:  Remember to drink plenty of fluid at least 6 glasses (8 oz each), eat healthy meals and do not skip any meals. Try to eat protein with a every meal and eat a healthy snack such as fruit or nuts in between meals. Try to keep a regular sleep-wake schedule and try to exercise daily, particularly in the form of walking, 20-30 minutes a day, if you can.   Try to stay active physically and mentally. Engage in social activities in your community and with your family and try to keep up with current events by reading the newspaper or watching the news. Try to do word puzzles and you may like to do puzzles and brain games on the computer such as on [email or web address]   As far as your medications are concerned, I would like hold off on any meds at this time. We may start a trial of Medication for Parkinson's disease.  As far as diagnostic testing, I will order: DaT scan: This is a specialized brain scan designed to help with diagnosis of tremor disorders. A radioactive marker gets injected and the uptake is measured in the brain and compared to normal controls and right side is compared to the left, a change in uptake can help with diagnosis of certain tremor disorders. A brain MRI on the other hand is a brain scan that helps look at the brain structure in more detail overall and look for age-related changes, blood vessel related changes and look for stroke and volume loss which we call atrophy.   I would like to see you back in about 2 months, sooner if we need to.  Please call us with any interim questions, concerns, problems, updates or refill requests.   I have made a referral to neuro rehab for PT and OT.  Our phone number is [phone]. We also have an after hours call service for urgent matters and there is a physician on-call for urgent questions, that cannot wait till the next work day. For any emergencies you know to call 911 or go to the nearest emergency room.   You can email me through my chart and also leave a phone message for Cyril Mourning, my nurse.

## 2019-04-11 ENCOUNTER — Ambulatory Visit: Payer: BC Managed Care – PPO | Admitting: Podiatry

## 2019-04-12 NOTE — Telephone Encounter (Addendum)
Waiting for Dr. Rexene Alberts to return on Monday to sign  Dat scan .  Dr. Rexene Alberts change order' s For Neuro Rehab.

## 2019-04-13 ENCOUNTER — Other Ambulatory Visit: Payer: Self-pay | Admitting: Podiatry

## 2019-04-13 ENCOUNTER — Ambulatory Visit (INDEPENDENT_AMBULATORY_CARE_PROVIDER_SITE_OTHER): Payer: BC Managed Care – PPO

## 2019-04-13 ENCOUNTER — Ambulatory Visit (INDEPENDENT_AMBULATORY_CARE_PROVIDER_SITE_OTHER): Payer: BC Managed Care – PPO | Admitting: Podiatry

## 2019-04-13 ENCOUNTER — Other Ambulatory Visit: Payer: Self-pay

## 2019-04-13 DIAGNOSIS — M79671 Pain in right foot: Secondary | ICD-10-CM

## 2019-04-13 DIAGNOSIS — M2042 Other hammer toe(s) (acquired), left foot: Secondary | ICD-10-CM | POA: Diagnosis not present

## 2019-04-13 DIAGNOSIS — M79672 Pain in left foot: Secondary | ICD-10-CM | POA: Diagnosis not present

## 2019-04-13 DIAGNOSIS — M2041 Other hammer toe(s) (acquired), right foot: Secondary | ICD-10-CM

## 2019-04-13 NOTE — Patient Instructions (Signed)
Pre-Operative Instructions  Congratulations, you have decided to take an important step towards improving your quality of life.  You can be assured that the doctors and staff at [facility] will be with you every step of the way.  Here are some important things you should know:  1. Plan to be at the surgery center/hospital at least 1 (one) hour prior to your scheduled time, unless otherwise directed by the surgical center/hospital staff.  You must have a responsible adult accompany you, remain during the surgery and drive you home.  Make sure you have directions to the surgical center/hospital to ensure you arrive on time. 2. If you are having surgery at [facility] or [facility], you will need a copy of your medical history and physical form from your family physician within one month prior to the date of surgery. We will give you a form for your primary physician to complete.  3. We make every effort to accommodate the date you request for surgery.  However, there are times where surgery dates or times have to be moved.  We will contact you as soon as possible if a change in schedule is required.   4. No aspirin/ibuprofen for one week before surgery.  If you are on aspirin, any non-steroidal anti-inflammatory medications (Mobic, Aleve, Ibuprofen) should not be taken seven (7) days prior to your surgery.  You make take Tylenol for pain prior to surgery.  5. Medications - If you are taking daily heart and blood pressure medications, seizure, reflux, allergy, asthma, anxiety, pain or diabetes medications, make sure you notify the surgery center/hospital before the day of surgery so they can tell you which medications you should take or avoid the day of surgery. 6. No food or drink after midnight the night before surgery unless directed otherwise by surgical center/hospital staff. 7. No alcoholic beverages 24-hours prior to surgery.  No smoking 24-hours prior or 24-hours after  surgery. 8. Wear loose pants or shorts. They should be loose enough to fit over bandages, boots, and casts. 9. Don't wear slip-on shoes. Sneakers are preferred. 10. Bring your boot with you to the surgery center/hospital.  Also bring crutches or a walker if your physician has prescribed it for you.  If you do not have this equipment, it will be provided for you after surgery. 11. If you have not been contacted by the surgery center/hospital by the day before your surgery, call to confirm the date and time of your surgery. 12. Leave-time from work may vary depending on the type of surgery you have.  Appropriate arrangements should be made prior to surgery with your employer. 13. Prescriptions will be provided immediately following surgery by your doctor.  Fill these as soon as possible after surgery and take the medication as directed. Pain medications will not be refilled on weekends and must be approved by the doctor. 14. Remove nail polish on the operative foot and avoid getting pedicures prior to surgery. 15. Wash the night before surgery.  The night before surgery wash the foot and leg well with water and the antibacterial soap provided. Be sure to pay special attention to beneath the toenails and in between the toes.  Wash for at least three (3) minutes. Rinse thoroughly with water and dry well with a towel.  Perform this wash unless told not to do so by your physician.  Enclosed: 1 Ice pack (please put in freezer the night before surgery)   1 Hibiclens skin cleaner     Pre-op instructions  If you have any questions regarding the instructions, please do not hesitate to call our office.  [place]: [address], [place], [place] [address] -- [phone]  [place]: [address] [place], [place] [address] -- [phone]  [place]: [address]  [place], [place] [address] -- [phone]  [place]: [address], [address], [place], [place] [address] -- [phone]  Website:  [email or web address] 

## 2019-04-13 NOTE — Progress Notes (Signed)
DG  °

## 2019-04-13 NOTE — Progress Notes (Signed)
Subjective:   Patient ID: Brady [name], male   DOB: 58 y.o.   MRN: [id number]   HPI Patient presents stating he is getting a lot of pain at the end of his toes 234 of both feet and he did have shoulder surgery and states that these toes are keeping him from being able to work and he also may have a mild case of Parkinson's and is going to physical therapy that is been recommended by neurologist.  Patient does not smoke likes to be active   Review of Systems  All other systems reviewed and are negative.       Objective:  Physical Exam Vitals signs and nursing note reviewed.  Constitutional:      Appearance: He is well-developed.  Pulmonary:     Effort: Pulmonary effort is normal.  Musculoskeletal: Normal range of motion.  Skin:    General: Skin is warm.  Neurological:     Mental Status: He is alert.     Neurovascular status found to be intact muscle strength found to be adequate range of motion within normal limits with patient noted to have rigid contracture digits 234 of both feet with distal keratotic lesions that are painful and are hard for him to walk with.  He does have slight tremor but I did not note any muscle strength loss or sharp dull or vibratory changes.  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  He is still out because of his shoulder and he wants to get these digits fixed and I have recommended digital fusion digits 2 through 4 of both feet and he would like to get them both done at the same time.  At this point I did go over consent form with him as he wants to get it done as soon as possible and I reviewed digital fusion of digits 234 and the fact he may develop distal deformity and ultimately could require other  surgery.  He is completely aware this he is willing to accept risk and we reviewed alternative treatments complications and after review he signed consent form.  He is given all instructions for surgery he is encouraged to call with any questions which may come out and is scheduled next week for digital procedures  X-rays indicate that there is significant digital contracture digits 234 both feet

## 2019-04-14 NOTE — Addendum Note (Signed)
Addended by: Star Age on: 04/14/2019 11:32 AM   Modules accepted: Orders

## 2019-04-16 ENCOUNTER — Emergency Department (HOSPITAL_COMMUNITY)
Admission: EM | Admit: 2019-04-16 | Discharge: 2019-04-17 | Disposition: A | Discharge status: Home / Self Care | Payer: BC Managed Care – PPO | Attending: Emergency Medicine | Admitting: Emergency Medicine

## 2019-04-16 ENCOUNTER — Emergency Department (HOSPITAL_COMMUNITY): Payer: BC Managed Care – PPO

## 2019-04-16 ENCOUNTER — Other Ambulatory Visit: Payer: Self-pay

## 2019-04-16 ENCOUNTER — Encounter (HOSPITAL_COMMUNITY): Payer: Self-pay | Admitting: Emergency Medicine

## 2019-04-16 DIAGNOSIS — Z79899 Other long term (current) drug therapy: Secondary | ICD-10-CM | POA: Insufficient documentation

## 2019-04-16 DIAGNOSIS — I10 Essential (primary) hypertension: Secondary | ICD-10-CM | POA: Insufficient documentation

## 2019-04-16 DIAGNOSIS — R152 Fecal urgency: Secondary | ICD-10-CM | POA: Diagnosis present

## 2019-04-16 DIAGNOSIS — K5641 Fecal impaction: Secondary | ICD-10-CM | POA: Diagnosis not present

## 2019-04-16 DIAGNOSIS — Z7982 Long term (current) use of aspirin: Secondary | ICD-10-CM | POA: Diagnosis not present

## 2019-04-16 MED ORDER — MAGNESIUM CITRATE PO SOLN
1.0000 | Freq: Once | ORAL | Status: AC
  Administered 2019-04-17: 1 via ORAL
  Filled 2019-04-16: qty 296

## 2019-04-16 MED ORDER — BISACODYL 10 MG RE SUPP
10.0000 mg | Freq: Once | RECTAL | Status: AC
  Administered 2019-04-16: 10 mg via RECTAL
  Filled 2019-04-16: qty 1

## 2019-04-16 NOTE — ED Notes (Signed)
Xray to room to pick up pt but pt refuses to go until he gets his enema.

## 2019-04-16 NOTE — ED Provider Notes (Signed)
[facility] EMERGENCY DEPARTMENT Provider Note   CSN: [id number] Arrival date & time: 04/16/19  1959     History   Chief Complaint Chief Complaint  Patient presents with  . Constipation    HPI Brady [name] is a 58 y.o. male.     Patient presents with sensation of needing to have a bowel movement and rectal urgency.  States this occurred about 4 hours ago when he had the sensation to defecate but was not able to.  He tried using MiraLAX and enema at home without relief.  Patient reports similar episode in October 2019.  States he normally moves his bowels every other day but over the past week he is been more constipated which she attributes to his new antidepressant sertraline.  States his last normal bowel movement was yesterday that was small.  He is still passing gas.  There is been no rectal bleeding, chills, fever, nausea or vomiting.  No abdominal pain, chest pain or shortness of breath.  No previous abdominal surgeries.  Complains of discomfort in his rectum and fullness with a sensation of needing to defecate. No weakness or numbness in his legs. No saddle anesthesia.   The history is provided by the patient.  Constipation Associated symptoms: no abdominal pain, no dysuria, no fever, no nausea and no vomiting     Past Medical History:  Diagnosis Date  . Anxiety   . Carotid artery stenosis   . Dyshydrosis   . Hyperlipidemia   . Hypertension     Patient Active Problem List   Diagnosis Date Noted  . Proximal humerus fracture 11/18/2018  . Controlled substance agreement signed 04/27/2017  . Other insomnia 10/23/2016  . Anxiety disorder 10/08/2015  . Carotid artery stenosis, asymptomatic 10/08/2015  . Dyshydrosis 10/08/2015  . Essential hypertension 10/08/2015  . Hyperlipidemia LDL goal <100 10/08/2015  . Obesity (BMI 30-39.9) 10/08/2015  . Screening for prostate cancer 10/08/2015    Past Surgical History:  Procedure Laterality Date  . ORIF HUMERUS FRACTURE  Right 11/18/2018   Procedure: OPEN REDUCTION INTERNAL FIXATION (ORIF) PROXIMAL HUMERUS FRACTURE;  Surgeon: Francena HanlySupple, Kevin, MD;  Location: [facility];  Service: Orthopedics;  Laterality: Right;        Home Medications    Prior to Admission medications   Medication Sig Start Date End Date Taking? Authorizing Provider  ALPRAZolam Prudy Feeler(XANAX) 0.25 MG tablet Take 0.25 mg by mouth at bedtime as needed for anxiety.    [provider]  aspirin 81 MG chewable tablet Chew 81 mg by mouth daily.     [provider]  busPIRone (BUSPAR) 10 MG tablet Take 10 mg by mouth.    [provider]  DULoxetine (CYMBALTA) 60 MG capsule Take 1 capsule by mouth at bedtime.  12/07/17   [provider]  lisinopril (PRINIVIL,ZESTRIL) 20 MG tablet Take 20 mg by mouth daily.  10/23/16   [provider]  lovastatin (MEVACOR) 20 MG tablet Take 20 mg by mouth daily.  10/23/16   [provider]  Multiple Vitamin (MULTI-VITAMINS) TABS Take 1 tablet by mouth daily.     [provider]  sertraline (ZOLOFT) 100 MG tablet sertraline 100 mg tablet  Take 1 tablet every day by oral route.    [provider]    Family History No family history on file.  Social History Social History   Tobacco Use  . Smoking status: Never Smoker  . Smokeless tobacco: Never Used  Substance Use Topics  . Alcohol use:  Not Currently  . Drug use: Not Currently     Allergies   Patient has no known allergies.   Review of Systems Review of Systems  Constitutional: Negative for activity change, appetite change and fever.  HENT: Negative for congestion.   Eyes: Negative for visual disturbance.  Respiratory: Negative for cough, chest tightness and shortness of breath.   Cardiovascular: Negative for chest pain.  Gastrointestinal: Positive for constipation and rectal pain. Negative for abdominal pain, nausea and vomiting.  Genitourinary: Negative for dysuria.  Musculoskeletal:  Negative for arthralgias and myalgias.  Neurological: Negative for dizziness, weakness and headaches.    all other systems are negative except as noted in the HPI and PMH.    Physical Exam Updated Vital Signs BP 116/66 (BP Location: Right Arm)   Pulse 94   Temp 98.2 F (36.8 C) (Oral)   Resp 17   Ht 5\' 9"  (1.753 m)   Wt 100.2 kg   SpO2 98%   BMI 32.64 kg/m   Physical Exam Vitals signs and nursing note reviewed.  Constitutional:      General: He is not in acute distress.    Appearance: He is well-developed.  HENT:     Head: Normocephalic and atraumatic.     Mouth/Throat:     Pharynx: No oropharyngeal exudate.  Eyes:     Conjunctiva/sclera: Conjunctivae normal.     Pupils: Pupils are equal, round, and reactive to light.  Neck:     Musculoskeletal: Normal range of motion and neck supple.     Comments: No meningismus. Cardiovascular:     Rate and Rhythm: Normal rate and regular rhythm.     Heart sounds: Normal heart sounds. No murmur.  Pulmonary:     Effort: Pulmonary effort is normal. No respiratory distress.     Breath sounds: Normal breath sounds.  Abdominal:     Palpations: Abdomen is soft.     Tenderness: There is no abdominal tenderness. There is no guarding or rebound.  Genitourinary:    Comments: Firm stool palpable just distal to fingertip.  Unable to manually disimpact. No external hemorrhoids or fissures Musculoskeletal: Normal range of motion.        General: No tenderness.  Skin:    General: Skin is warm.     Capillary Refill: Capillary refill takes less than 2 seconds.  Neurological:     General: No focal deficit present.     Mental Status: He is alert and oriented to person, place, and time. Mental status is at baseline.     Cranial Nerves: No cranial nerve deficit.     Motor: No abnormal muscle tone.     Coordination: Coordination normal.     Comments: No ataxia on finger to nose bilaterally. No pronator drift. 5/5 strength throughout. CN 2-12  intact.Equal grip strength. Sensation intact.   Psychiatric:        Behavior: Behavior normal.      ED Treatments / Results  Labs (all labs ordered are listed, but only abnormal results are displayed) Labs Reviewed  CBC WITH DIFFERENTIAL/PLATELET - Abnormal; Notable for the following components:      Result Value   WBC 14.9 (*)    Neutro Abs 12.6 (*)    All other components within normal limits  COMPREHENSIVE METABOLIC PANEL - Abnormal; Notable for the following components:   Glucose, Bld 127 (*)    BUN 22 (*)    All other components within normal limits  LIPASE, BLOOD - Abnormal; Notable for  the following components:   Lipase 84 (*)    All other components within normal limits    EKG None  Radiology Dg Abdomen Acute W/chest  Result Date: 04/17/2019 CLINICAL DATA:  Constipation for 5 days. EXAM: DG ABDOMEN ACUTE W/ 1V CHEST COMPARISON:  Chest CTA 11/16/2018 FINDINGS: The cardiomediastinal contours are normal. The lungs are clear. There is no free intra-abdominal air. Air-fluid level in the stomach. Moderate stool in the hepatic flexure, transverse colon, and splenic flexure. Moderate rectal stool, rectal distention of 6.6 cm. No acute osseous abnormalities are seen. Surgical hardware in the right proximal humerus partially included. IMPRESSION: Moderate colonic stool burden. Stool distends the rectum with rectal distention of 6.6 cm. No bowel obstruction or free air. Electronically Signed   By: Narda RutherfordMelanie  Sanford M.D.   On: 04/17/2019 00:43    Procedures Fecal disimpaction  Date/Time: 04/17/2019 12:50 AM Performed by: Glynn Octaveancour, Tyrae, MD Authorized by: Glynn Octaveancour, Ervie, MD  Consent: Verbal consent obtained. Risks and benefits: risks, benefits and alternatives were discussed Consent given by: patient Patient understanding: patient states understanding of the procedure being performed Patient consent: the patient's understanding of the procedure matches consent given  Procedure consent: procedure consent matches procedure scheduled Relevant documents: relevant documents present and verified Test results: test results available and properly labeled Site marked: the operative site was marked Imaging studies: imaging studies available Patient identity confirmed: verbally with patient and provided demographic data Local anesthesia used: no  Anesthesia: Local anesthesia used: no  Sedation: Patient sedated: no  Patient tolerance: patient tolerated the procedure well with no immediate complications    (including critical care time)  Medications Ordered in ED Medications - No data to display   Initial Impression / Assessment and Plan / ED Course  I have reviewed the triage vital signs and the nursing notes.  Pertinent labs & imaging results that were available during my care of the patient were reviewed by me and considered in my medical decision making (see chart for details).       Fecal impaction with constipation.  Abdomen soft without peritoneal signs.  Patient unable to hold an enema.  He is given Dulcolax suppository as well as p.o. magnesium citrate.  X-ray shows distended rectum and constipation without bowel obstruction.  Fecal disimpaction was able to be performed.  Patient given multiple rounds of enemas and multiple rounds of manual disimpaction.  He had difficulty holding in any enemas.   Large amount of stool was evacuated manually and no further stool could be reached.  Labs are reassuring with stable leukocytosis and normal creatinine and LFTs.  Abdomen soft and nontender.  X-ray shows no bowel obstruction but does show large amount of stool with fecal impaction.  Patient with no further results throughout ED course despite multiple enemas, magnesium citrate, Dulcolax and manual disimpaction.  Observation admission offered but patient wishes to go home and states he feels better.  We will start MiraLAX as well as Colace.  Follow-up with PCP.  Return to the ED with worsening symptoms including abdominal pain, rectal pain, rectal bleeding, fever, inability urinate or other concerns.  Final Clinical Impressions(s) / ED Diagnoses   Final diagnoses:  Fecal impaction [facility])    ED Discharge Orders    None       Glynn Octaveancour, Ryen, MD 04/17/19 [phone]

## 2019-04-16 NOTE — ED Triage Notes (Signed)
Pt states around 4 hours ago he "could no finish using the bathroom." Pt states he tried taking Miralax and an enema with no results. Pt denies pain.

## 2019-04-17 LAB — CBC WITH DIFFERENTIAL/PLATELET
Abs Immature Granulocytes: 0.06 10*3/uL (ref 0.00–0.07)
Basophils Absolute: 0 10*3/uL (ref 0.0–0.1)
Basophils Relative: 0 %
Eosinophils Absolute: 0 10*3/uL (ref 0.0–0.5)
Eosinophils Relative: 0 %
HCT: 41 % (ref 39.0–52.0)
Hemoglobin: 13.6 g/dL (ref 13.0–17.0)
Immature Granulocytes: 0 %
Lymphocytes Relative: 8 %
Lymphs Abs: 1.1 10*3/uL (ref 0.7–4.0)
MCH: 28.9 pg (ref 26.0–34.0)
MCHC: 33.2 g/dL (ref 30.0–36.0)
MCV: 87 fL (ref 80.0–100.0)
Monocytes Absolute: 1 10*3/uL (ref 0.1–1.0)
Monocytes Relative: 7 %
Neutro Abs: 12.6 10*3/uL — ABNORMAL HIGH (ref 1.7–7.7)
Neutrophils Relative %: 85 %
Platelets: 253 10*3/uL (ref 150–400)
RBC: 4.71 MIL/uL (ref 4.22–5.81)
RDW: 14.2 % (ref 11.5–15.5)
WBC: 14.9 10*3/uL — ABNORMAL HIGH (ref 4.0–10.5)
nRBC: 0 % (ref 0.0–0.2)

## 2019-04-17 LAB — COMPREHENSIVE METABOLIC PANEL
ALT: 33 U/L (ref 0–44)
AST: 31 U/L (ref 15–41)
Albumin: 4.5 g/dL (ref 3.5–5.0)
Alkaline Phosphatase: 75 U/L (ref 38–126)
Anion gap: 9 (ref 5–15)
BUN: 22 mg/dL — ABNORMAL HIGH (ref 6–20)
CO2: 23 mmol/L (ref 22–32)
Calcium: 9.5 mg/dL (ref 8.9–10.3)
Chloride: 104 mmol/L (ref 98–111)
Creatinine, Ser: 0.94 mg/dL (ref 0.61–1.24)
GFR calc Af Amer: >60 mL/min (ref >60)
GFR calc non Af Amer: >60 mL/min (ref >60)
Glucose, Bld: 127 mg/dL — ABNORMAL HIGH (ref 70–99)
Potassium: 3.7 mmol/L (ref 3.5–5.1)
Sodium: 136 mmol/L (ref 135–145)
Total Bilirubin: 1.1 mg/dL (ref 0.3–1.2)
Total Protein: 7.6 g/dL (ref 6.5–8.1)

## 2019-04-17 LAB — LIPASE, BLOOD: Lipase: 84 U/L — ABNORMAL HIGH (ref 11–51)

## 2019-04-17 MED ORDER — FLEET ENEMA 7-19 GM/118ML RE ENEM
1.0000 | ENEMA | Freq: Once | RECTAL | Status: AC
  Administered 2019-04-17: 01:00:00 1 via RECTAL

## 2019-04-17 MED ORDER — SORBITOL 70 % SOLN
960.0000 mL | TOPICAL_OIL | Freq: Once | ORAL | Status: DC
  Filled 2019-04-17: qty 473

## 2019-04-17 MED ORDER — DOCUSATE SODIUM 100 MG PO CAPS: 100.0000 mg | ORAL_CAPSULE | Freq: Two times a day (BID) | ORAL | 0 refills | Status: AC

## 2019-04-17 MED ORDER — POLYETHYLENE GLYCOL 3350 17 G PO PACK: 17.0000 g | PACK | Freq: Every day | ORAL | 0 refills | Status: DC

## 2019-04-17 MED ORDER — MINERAL OIL RE ENEM
1.0000 | ENEMA | Freq: Once | RECTAL | Status: AC
  Administered 2019-04-17: 03:00:00 1 via RECTAL
  Filled 2019-04-17: qty 1

## 2019-04-17 MED ORDER — MAGNESIUM HYDROXIDE 400 MG/5ML PO SUSP
30.0000 mL | Freq: Once | ORAL | Status: AC
  Administered 2019-04-17: 02:00:00 30 mL via ORAL
  Filled 2019-04-17: qty 30

## 2019-04-17 MED ORDER — SODIUM CHLORIDE 0.9 % IV BOLUS
1000.0000 mL | Freq: Once | INTRAVENOUS | Status: AC
  Administered 2019-04-17: 04:00:00 1000 mL via INTRAVENOUS

## 2019-04-17 NOTE — ED Notes (Signed)
Fleets enema offered to pt. Pt wants wife to give it to him in bathroom. Made sure wife was aware that she needed to remove orange cap prior to inserting in pt's rectum. Wife verbalized understanding.

## 2019-04-17 NOTE — Discharge Instructions (Signed)
Increase your fluid intake and fiber intake.  Take the MiraLAX as prescribed.  Follow-up with your doctor regarding her medications to see if this is contributing to her constipation.  Return to the ED with abdominal pain, rectal pain, rectal bleeding, fever, inability to urinate or any other concerns.

## 2019-04-17 NOTE — ED Notes (Signed)
Pt has not been able to retain any significant amount of any of the enemas he has been given thus far. Dr Wyvonnia Dusky notified of this.

## 2019-04-17 NOTE — ED Notes (Signed)
Attempted to give pt soap suds enema. Pt was unable to hold it in. Dr. Wyvonnia Dusky notified and new orders given and carried out accordingly.

## 2019-04-18 ENCOUNTER — Telehealth: Payer: Self-pay | Admitting: Neurology

## 2019-04-18 ENCOUNTER — Telehealth: Payer: Self-pay | Admitting: *Deleted

## 2019-04-18 NOTE — Telephone Encounter (Signed)
DOS 04/19/2019; HAMMER TOE REPAIR 2,3,4 W/ PIN B/L FOOT  BCBS: Effective Date - 09/01/2008 - 08/31/9998   In-Network Individual  [employer] Coinsurance Authorization Required  Not Applicable  09%  Unknown  Vendor: [employer] :    Per Amber H. Authorization is NOT REQUIRED Call Ref.# is [id number]

## 2019-04-18 NOTE — Telephone Encounter (Signed)
Patient wants to no if we received his disability paper work

## 2019-04-19 ENCOUNTER — Encounter: Payer: Self-pay | Admitting: Podiatry

## 2019-04-19 DIAGNOSIS — M2042 Other hammer toe(s) (acquired), left foot: Secondary | ICD-10-CM

## 2019-04-19 DIAGNOSIS — M2041 Other hammer toe(s) (acquired), right foot: Secondary | ICD-10-CM | POA: Diagnosis not present

## 2019-04-20 ENCOUNTER — Ambulatory Visit (INDEPENDENT_AMBULATORY_CARE_PROVIDER_SITE_OTHER): Payer: Self-pay | Admitting: Podiatry

## 2019-04-20 ENCOUNTER — Other Ambulatory Visit: Payer: Self-pay

## 2019-04-20 DIAGNOSIS — M79672 Pain in left foot: Secondary | ICD-10-CM

## 2019-04-20 DIAGNOSIS — M79671 Pain in right foot: Secondary | ICD-10-CM

## 2019-04-20 NOTE — Progress Notes (Signed)
Subjective:   Patient ID: Brady [name], male   DOB: 58 y.o.   MRN: [id number]   HPI Patient presents just concerned because he had some bleeding of his toe stating is having minimal discomfort   ROS      Objective:  Physical Exam  Dressings are intact with mild bleeding of the distal portion that is not abnormal for this.  Postop with minimal discomfort     Assessment:  Mild bleeding normal for 1 day postop     Plan:  Sterile dressings applied and gave instructions for continued elevation and reappoint to recheck for regular visit

## 2019-04-25 NOTE — Telephone Encounter (Signed)
I spoke to the patient and informed him that his DAT scan was still pending insurance. DW

## 2019-04-25 NOTE — Telephone Encounter (Signed)
Patient calling about  Disability paper work.

## 2019-04-26 ENCOUNTER — Other Ambulatory Visit: Payer: BC Managed Care – PPO

## 2019-04-26 NOTE — Telephone Encounter (Signed)
Please call patient and advise him that I cannot fill out paperwork for him quite yet, I am not comfortable filling out any paperwork Regarding his functional capacity, as we are still in the early process of diagnosis and I have only evaluated him once 3 weeks ago.  We are in the process of doing more testing, he is pending for his DaTscan as I understand.  Please advise patient that he also has evaluations with neuro rehab pending which will help [place] in his evaluation.

## 2019-04-26 NOTE — Telephone Encounter (Signed)
I called pt and discussed this with him. His orthopedic MD wrote him out of work to begin with. I recommended that he follow up with that MD regarding disability. Pt verbalized understanding.

## 2019-04-26 NOTE — Telephone Encounter (Signed)
Received disability paperwork from [employer]. They need clinical information that supports the pt's inability to return to work due to reduced functional capacity and an estimated return to work date.

## 2019-04-26 NOTE — Telephone Encounter (Signed)
Called and left message that his DAT scan was approved and that [facility] will be calling him today buy 4:00

## 2019-04-26 NOTE — Telephone Encounter (Signed)
This encounter was created in error - please disregard.

## 2019-04-27 ENCOUNTER — Other Ambulatory Visit: Payer: BC Managed Care – PPO

## 2019-04-29 ENCOUNTER — Other Ambulatory Visit: Payer: BC Managed Care – PPO

## 2019-04-29 ENCOUNTER — Ambulatory Visit (INDEPENDENT_AMBULATORY_CARE_PROVIDER_SITE_OTHER): Payer: BC Managed Care – PPO | Admitting: Podiatry

## 2019-04-29 ENCOUNTER — Other Ambulatory Visit: Payer: Self-pay

## 2019-04-29 ENCOUNTER — Ambulatory Visit: Payer: BC Managed Care – PPO | Admitting: Occupational Therapy

## 2019-04-29 ENCOUNTER — Ambulatory Visit: Payer: BC Managed Care – PPO | Attending: Neurology

## 2019-04-29 ENCOUNTER — Ambulatory Visit (INDEPENDENT_AMBULATORY_CARE_PROVIDER_SITE_OTHER): Payer: BC Managed Care – PPO

## 2019-04-29 DIAGNOSIS — M2042 Other hammer toe(s) (acquired), left foot: Secondary | ICD-10-CM

## 2019-04-29 DIAGNOSIS — R29898 Other symptoms and signs involving the musculoskeletal system: Secondary | ICD-10-CM | POA: Diagnosis present

## 2019-04-29 DIAGNOSIS — M25612 Stiffness of left shoulder, not elsewhere classified: Secondary | ICD-10-CM | POA: Insufficient documentation

## 2019-04-29 DIAGNOSIS — R29818 Other symptoms and signs involving the nervous system: Secondary | ICD-10-CM

## 2019-04-29 DIAGNOSIS — Z09 Encounter for follow-up examination after completed treatment for conditions other than malignant neoplasm: Secondary | ICD-10-CM

## 2019-04-29 DIAGNOSIS — R2681 Unsteadiness on feet: Secondary | ICD-10-CM | POA: Diagnosis present

## 2019-04-29 DIAGNOSIS — M2041 Other hammer toe(s) (acquired), right foot: Secondary | ICD-10-CM

## 2019-04-29 DIAGNOSIS — R41844 Frontal lobe and executive function deficit: Secondary | ICD-10-CM | POA: Insufficient documentation

## 2019-04-29 DIAGNOSIS — M25611 Stiffness of right shoulder, not elsewhere classified: Secondary | ICD-10-CM | POA: Insufficient documentation

## 2019-04-29 DIAGNOSIS — R2689 Other abnormalities of gait and mobility: Secondary | ICD-10-CM

## 2019-04-29 DIAGNOSIS — R278 Other lack of coordination: Secondary | ICD-10-CM

## 2019-04-29 DIAGNOSIS — M6281 Muscle weakness (generalized): Secondary | ICD-10-CM | POA: Diagnosis present

## 2019-04-29 NOTE — Therapy (Signed)
[facility] [address] [place], [place], [address] Phone: [phone]   Fax:  [phone]  Physical Therapy Evaluation  Patient Details  Name: Brady [name] MRN: [id number] Date of Birth: 14-Jan-1961 Referring Provider (PT): Dr. Frances Furbish   Encounter Date: 04/29/2019  PT End of Session - 04/29/19 1605    Visit Number  1    Number of Visits  9    Date for PT Re-Evaluation  06/28/19    Authorization Type  BCBS: 30 visit limit, 12 used for ortho PT (s/p ORIF), OT and PT count as 1 visit if on same day per chart.    Authorization - Visit Number  13    Authorization - Number of Visits  30    PT Start Time  1401    PT Stop Time  1446    PT Time Calculation (min)  45 min    Equipment Utilized During Treatment  Other (comment)   min guard to S prn   Activity Tolerance  Patient tolerated treatment well    Behavior During Therapy  Anxious       Past Medical History:  Diagnosis Date  . Anxiety   . Carotid artery stenosis   . Dyshydrosis   . Hyperlipidemia   . Hypertension     Past Surgical History:  Procedure Laterality Date  . ORIF HUMERUS FRACTURE Right 11/18/2018   Procedure: OPEN REDUCTION INTERNAL FIXATION (ORIF) PROXIMAL HUMERUS FRACTURE;  Surgeon: Francena Hanly, MD;  Location: [facility];  Service: Orthopedics;  Laterality: Right;    There were no vitals filed for this visit.   Subjective Assessment - 04/29/19 1410    Subjective  Pt reported everything started with RLE sciatic and low back pain in 2018. Pt and wife began to notice that balance and R sided strength became worse, along with tremors in abdominals and hands. Pt has experienced two falls in the last year, one in 10/2018 resulted in L scapular fx and R humerus fx-ORIF on 11/18/18. Tremors began in 01/2019. Pt finished ortho PT the first week of August and does not have precautions. Pt had B hammertoe (digits 2-4) surgery on 04/19/19 so pt is using standard  walker and has post-surgical shoes donned for amb. Pt is WBAT and podiatrist stated pt was cleared for PT. Pt was using quad cane but has been using standard walker since 04/19/19 hammer toe surgery. PT asked if pt would be open to trialing RW and pt became very anxious and was worried about "being out of control" on hardwood floors at home.    Patient is accompained by:  Family member   [name]   Pertinent History  HTN, HLD, anxiety, carotid stenosis, obesity, hx of R humerus ORIF and L scapula fx due to 3/20 fall, hammer toe surgery on 04/19/19 (digits 2-4)    Patient Stated Goals  Improve strength, improve endurance, improve balance (Zoloft made balance worse)    Currently in Pain?  No/denies   but does have intermittent B foot pain s/p foot surgery (L>R)        [facility] PT Assessment - 04/29/19 1420      Assessment   Medical Diagnosis  Parkinsonism    Referring Provider (PT)  Dr. Frances Furbish    Onset Date/Surgical Date  09/01/16   approx. since 2018   Hand Dominance  Left    Prior Therapy  OP ortho s/p R humerus ORIF      Precautions   Precautions  Fall  Precaution Comments  WBAT on B feet s/p hammertoe surgery      Restrictions   Weight Bearing Restrictions  Yes    RLE Weight Bearing  Weight bearing as tolerated    LLE Weight Bearing  Weight bearing as tolerated      Balance Screen   Has the patient fallen in the past 6 months  Yes    How many times?  1   fell down 15 stairs   Has the patient had a decrease in activity level because of a fear of falling?   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 4" proximal to medial ankle, down to ankle N/T   Additional Comments  RLE N/T (ankle to calf) with hx of sciatic pain and LBP (lx stenosis).      Coordination   Gross Motor Movements are Fluid and Coordinated  No    Fine Motor Movements are Fluid and Coordinated  No    Coordination and Movement Description  Decr. speed and B hand tremors noted during fingers to thumbs    Heel Shin Test  Decr. speed      Posture/Postural Control   Posture/Postural Control  Postural limitations    Postural Limitations  Forward head      Tone   Assessment Location  Other (comment)   tremors noted in B hands     Tone Assessment - Other   Other Tone Location  Tremors noted in B hands with MD notes reporting cogwheel rigidity      ROM / Strength   AROM / PROM / Strength  AROM;Strength      AROM   Overall AROM   Deficits    Overall AROM Comments  BLE AROM WFL, dorsiflexion slightly limited 2/2  post-op shoes donned and pt with decr. dorsiflexion during amb.      Strength   Overall Strength  Deficits    Overall Strength Comments  BLE grossly: 4/5, except for 3+/5 B knee flexion and B hip abd      Transfers  Transfers  Sit to Stand;Stand to Sit    Sit to Stand  5: Supervision;Without upper  extremity assist;From chair/3-in-1    Stand to Sit  5: Supervision;Without upper extremity assist;To chair/3-in-1      Ambulation/Gait   Ambulation/Gait  Yes    Ambulation/Gait Assistance  4: Min guard;5: Supervision    Ambulation/Gait Assistance Details  Cues for sequencing with standard walker as pt carries walker but did not wish to trial RW. Cues to improve stride length, heel strike.    Ambulation Distance (Feet)  100 Feet    Assistive device  Standard walker    Gait Pattern  Step-to pattern;Step-through pattern;Decreased stride length;Decreased dorsiflexion - left;Decreased dorsiflexion - right;Decreased hip/knee flexion - right;Decreased hip/knee flexion - left;Shuffle    Ambulation Surface  Level;Indoor    Gait velocity  2.5017ft/sec. with standard walker, mostly holding it.       Standardized Balance Assessment   Standardized Balance Assessment  Timed Up and Go Test      Timed Up and Go Test   TUG  Normal TUG;Cognitive TUG    Normal TUG (seconds)  18.24   standard walker   Cognitive TUG (seconds)  28.66   standard walker               Objective measurements completed on examination: See above findings.              PT Education - 04/29/19 1603    Education Details  PT educated pt on exam findings, frequency, duration, and outcome measure results. PT also educated pt on sequencing with standard walker, as pt carries walker. PT and OT discussed using RW and OT had pt try it upon leaving clinic and educated pt and pt's wife again.    Person(s) Educated  Patient;Spouse    Methods  Explanation;Demonstration;Verbal cues    Comprehension  Returned demonstration;Verbalized understanding       PT Short Term Goals - 04/29/19 1624      PT SHORT TERM GOAL #1   Title  Perform FGA or Mini BESTest and write goals as indicated. TARGET DATE FOR ALL STGS: 06/26/19    Time  4    Period  Weeks    Status  New      PT SHORT TERM GOAL #2   Title  Pt will be perform HEP  with S from wife to improve strength, balance, and gait.    Time  4    Period  Weeks    Status  New      PT SHORT TERM GOAL #3   Title  Pt will perform normal TUG with LRAD in </=13.5 sec. to decr. falls risk.    Time  4    Status  New      PT SHORT TERM GOAL #4   Title  Pt will improve gait speed with LRAD to >/=2.5362ft/sec. to safely amb. in the community.    Time  4    Period  Weeks    Status  New      PT SHORT TERM GOAL #5   Title  Pt will amb. 500' with LRAD at MOD I level, over even terrain to improve functional mobility.    Time  4    Period  Weeks    Status  New        PT Long Term Goals - 04/29/19 1627      PT LONG TERM GOAL #1   Title  Pt will improve cognitive  TUG time with LRAD to </=15 sec. to decr. falls risk. TARGET DATE FOR ALL LTGS: 06/24/19    Time  8    Period  Weeks    Status  New      PT LONG TERM GOAL #2   Title  Pt will amb. 1000' over paved surfaces with LRAD at MOD I level    Time  8    Period  Weeks    Status  New      PT LONG TERM GOAL #3   Title  Pt will be IND in progressed HEP to improve strength, balance, and gait.    Time  8    Period  Weeks    Status  New      PT LONG TERM GOAL #4   Title  Pt will report no falls over last four weeks to improve safety.    Time  8    Period  Weeks    Status  New             Plan - 04/29/19 1615    Clinical Impression Statement  Pt is a 58y/o male presenting to [facility]PP[facility] neuro for Parkinsonism and is currently undergoing testing. Pt's PMH significant for the following: HTN, HLD, anxiety, carotid stenosis, obesity, hx of R humerus ORIF and L scapula fx due to 3/20 fall, hammer toe surgery on 04/19/19 (digits 2-4). Pt's gait speed indicated pt is not able to safely amb. in the community. Pt's normal and cognitive TUG times both indicate pt is at a high risk for falls. PT will perform dynamic balance/gait testing next session as this session was limited 2/2 time constraints. The following deficits were  noted upon exam: gait deviations, impaired balance, impaired strength. decr. coordination, impaired posture, tremors, and decr. endurance. Pt's memory appears to be impaired from baseline with impulsive tendencies. Pt would benefit from skilled PT to improve safety during functional mobility.    Personal Factors and Comorbidities  Comorbidity 3+;Behavior Pattern    Comorbidities  see above    Examination-Activity Limitations  Carry;Locomotion Level;Stand;Lift    Examination-Participation Restrictions  Other   work-currently on short-term disability   Stability/Clinical Decision Making  Evolving/Moderate complexity    Clinical Decision Making  Moderate    Rehab Potential  Good    PT Frequency  2x / week   will schedule once a week right now 2/2 insurance limits visits   PT Duration  8 weeks    PT Treatment/Interventions  ADLs/Self Care Home Management;Electrical Stimulation;DME Instruction;Gait training;Stair training;Functional mobility training;Therapeutic activities;Therapeutic exercise;Balance training;Orthotic Fit/Training;Patient/family education;Cognitive remediation;Neuromuscular re-education;Manual techniques;Vestibular    PT Next Visit Plan  Perform Mini BESTest or FGA and write goals as indicated. Trial gait training with RW vs. standard walker (pt anxious about this). Establish HEP.    Consulted and Agree with Plan of Care  Patient;Family member/caregiver    Family Member Consulted  wife: Myra       Patient will benefit from skilled therapeutic intervention in order to improve the following deficits and impairments:  Abnormal gait, Decreased coordination, Decreased range of motion, Difficulty walking, Impaired tone, Decreased endurance, Decreased knowledge of use of DME, Decreased balance, Decreased mobility, Decreased cognition, Decreased strength, Impaired sensation, Postural dysfunction, Impaired flexibility, Pain(pt reported pain in foot 2/2 surgery during gait, PT will monitor  closely but not directly treat)  Visit Diagnosis: Other abnormalities of gait and mobility - Plan: PT plan of care cert/re-cert  Muscle weakness (generalized) - Plan: PT plan of  care cert/re-cert  Other lack of coordination - Plan: PT plan of care cert/re-cert  Unsteadiness on feet - Plan: PT plan of care cert/re-cert     Problem List Patient Active Problem List   Diagnosis Date Noted  . Proximal humerus fracture 11/18/2018  . Controlled substance agreement signed 04/27/2017  . Other insomnia 10/23/2016  . Anxiety disorder 10/08/2015  . Carotid artery stenosis, asymptomatic 10/08/2015  . Dyshydrosis 10/08/2015  . Essential hypertension 10/08/2015  . Hyperlipidemia LDL goal <100 10/08/2015  . Obesity (BMI 30-39.9) 10/08/2015  . Screening for prostate cancer 10/08/2015    Lauriann Milillo L 04/29/2019, 4:34 PM  [facility] [address] [place], [place], [address] Phone: [phone]   Fax:  [phone]  Name: Brady [name] MRN: [id number] Date of Birth: 1960-10-14  Zerita Boers, PT,DPT 04/29/19 4:34 PM Phone: [phone] Fax: [phone]

## 2019-05-01 ENCOUNTER — Encounter: Payer: Self-pay | Admitting: Occupational Therapy

## 2019-05-01 NOTE — Therapy (Signed)
[facility] [address] [place], [place], [address] Phone: [phone]   Fax:  [phone]  Occupational Therapy Evaluation  Patient Details  Name: Brady PilotStephen [name] MRN: [name] Date of Birth: [birth year] Referring Provider (OT): Dr. Frances FurbishAthar   Encounter Date: 04/29/2019  OT End of Session - 05/01/19 1301    Visit Number  1    Number of Visits  13    Date for OT Re-Evaluation  07/30/19    Authorization Type  BCBS    Authorization - Visit Number  1   12/30 visits used previously   Authorization - Number of Visits  13    OT Start Time  1452    OT Stop Time  1530    OT Time Calculation (min)  38 min    Activity Tolerance  Patient tolerated treatment well    Behavior During Therapy  Restless;Impulsive       Past Medical History:  Diagnosis Date  . Anxiety   . Carotid artery stenosis   . Dyshydrosis   . Hyperlipidemia   . Hypertension     Past Surgical History:  Procedure Laterality Date  . ORIF HUMERUS FRACTURE Right 11/18/2018   Procedure: OPEN REDUCTION INTERNAL FIXATION (ORIF) PROXIMAL HUMERUS FRACTURE;  Surgeon: Francena HanlySupple, Kevin, MD;  Location: [facility];  Service: Orthopedics;  Laterality: Right;    There were no vitals filed for this visit.  Subjective Assessment - 05/01/19 1258    Pertinent History  Parkinsonism,L scapular fx and right humeral fx in March 2020-no precautions, hammertoe surgery    Patient Stated Goals  maximize independence    Currently in Pain?  No/denies        [facility] Assessment - 05/01/19 0001      Assessment   Medical Diagnosis  Parkinsonism    Referring Provider (OT)  Dr. Frances FurbishAthar    Onset Date/Surgical Date  09/01/16    Hand Dominance  Left    Prior Therapy  OP ortho s/p R humerus ORIF      Precautions   Precautions  Fall    Precaution Comments  WBAT on B feet s/p hammertoe surgery      Restrictions   Weight Bearing Restrictions  Yes    RLE Weight Bearing  Weight bearing  as tolerated    LLE Weight Bearing  Weight bearing as tolerated      Balance Screen   Has the patient fallen in the past 6 months  Yes    How many times?  2    Has the patient had a decrease in activity level because of a fear of falling?   Yes    Is the patient reluctant to leave their home because of a fear of falling?   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      IADL   Shopping  Completely unable to shop    Light Housekeeping  Needs help with all home maintenance tasks    Medication Management  Is responsible for taking medication in correct dosages at correct time      Mobility   Mobility Status  Independent   with walker     Written Expression   Dominant Hand  Left    Handwriting  Mild micrographia;100% legible      Cognition   Overall Cognitive Status  Impaired/Different from baseline    Area of Impairment  Memory    Memory  Decreased short-term memory    Memory Comments  Pt's wife reports pt has "sun downers" and anxious at night    Memory  Impaired    Memory Impairment  Decreased short term memory    Awareness  Impaired    Problem Solving  Impaired    Behaviors  Poor frustration tolerance   quick tempered     Observation/Other Assessments   Other  Surveys   Select    Physical Performance Test    Yes    Simulated Eating Time (seconds)  10.85    Donning Doffing Jacket Time (seconds)  30.28    Donning Doffing Jacket Comments  3 button/ unbutton 1 min 24 secs      Posture/Postural Control   Posture/Postural Control  Postural limitations    Postural Limitations  Forward head      Sensation   Light Touch  Appears Intact      Coordination   Gross Motor Movements are Fluid and Coordinated  No    Fine Motor Movements are Fluid and Coordinated  No    9 Hole Peg Test  Right;Left    Right 9 Hole Peg Test  38.12    Left 9 Hole Peg Test  35.13    Box and Blocks  RUE 43, LUE 44      Tone Assessment - Other   Other Tone Location  tremors bilateral      AROM   Overall AROM   Deficits    Overall AROM Comments  RUE sh. flexion 90, elbow extension -20, abduction 90, supination 90%, LUE sh. flexion 120, elbow extension -20      RUE Tone   RUE Tone  Mild   rigidity     LUE Tone   LUE Tone  Mild   rigidity                       OT Short Term Goals - 05/01/19 1313      OT SHORT TERM GOAL #1   Title  I with HEP.    Time  4    Period  Weeks    Status  New      OT SHORT TERM GOAL #2   Title  Pt will demonstrate understanding of adapted strategies to maximize safety and independence with ADLs/IADLs.    Time  4    Period  Weeks    Status  New      OT SHORT TERM GOAL #3   Title  Pt will demonstrate ability to retrieve a lightweight object with RUE at 100 shoulder flexion with -  15 elbow extension    Baseline  90, -20    Time  4    Period  Weeks    Status  New      OT SHORT TERM GOAL #4   Title  Pt will demonstrate ability to retrieve a lightweight object with LUE at 125 shoulder flexion and -15 elbow ext.    Baseline  120, -20    Time  4    Period  Weeks    Status  New      OT SHORT TERM GOAL #5   Title  Pt will demonstrate ability to write a short paragraph with no significant decrease in letter size and  100% legibility.    Time  4    Period  Weeks    Status  New        OT Long Term Goals - 05/01/19 1320      OT LONG TERM GOAL #1   Title  Pt will verbalize understanding of ways to prevent future PD related complication and community resources.    Time  12    Period  Weeks    Status  New    Target Date  07/30/19      OT LONG TERM GOAL #2   Title  Pt will demonstrate improved ease with dressing as evidenced by decreasing PPT#4 to 25 secs or less    Baseline  30.28 secs    Time  12    Period  Weeks    Status  New      OT LONG TERM GOAL #3   Title  Pt will demonstrate increased ease with fastening buttons as evidenced by decreasing 3 button/ unbutton to 75 secs or less    Baseline  84 secs    Time  12    Period  Weeks    Status  New      OT LONG TERM GOAL #4   Title  Pt will demonstrate improved bilateral UE functional use as evidenced by increasing bilateral box/ blocks score by 3 blocks.    Baseline  RUE 43, LUE 44    Time  12    Period  Weeks    Status  New            Plan - 05/01/19 1309    Clinical Impression Statement  Pt is a 58 y.o male diagnosed with Parkinsonism, whith recent hx of R humerus ORIF and L scapula fx 10/2018 and recent hammertoe surgery 04/19/2019.  Pt presents with the following deficits: decreased ROM, decreased coordination, decreased functional mobility, cognitive deficits, decreased balance, rigidity, bradykinesia which impedes performance of ADLs/IADLs. Pt can benefit fom skilled occupational therapy to maximize pt's safety and independence with ADLs/IADLs. Pt was working prior to his injuries as an Therapist, music, currently he is on short term disability.    OT Occupational Profile and History  Detailed Assessment- Review of Records and additional review of physical, cognitive, psychosocial history related to current functional performance    Occupational performance deficits (Please refer to evaluation for details):  ADL's;IADL's;Work;Leisure;Social  Participation    Body Structure / Function / Physical Skills  ADL;Decreased knowledge of precautions;Flexibility;ROM;UE functional use;Mobility;FMC;Decreased knowledge of use of DME;Balance;Dexterity;Gait;GMC;Strength;Pain;Endurance;Coordination;IADL;Tone    Cognitive Skills  Attention;Memory;Problem Solve;Safety Awareness;Sequencing;Thought    Rehab Potential  Good    Clinical Decision Making  Several treatment options, min-mod task modification necessary    Comorbidities Affecting Occupational Performance:  May have comorbidities impacting occupational performance    Modification or  Assistance to Complete Evaluation   Min-Moderate modification of tasks or assist with assess necessary to complete eval   extra time, verbal cues   OT Frequency  1x / week    OT Duration  12 weeks   plus eval, may d/c after 8 weeks   OT Treatment/Interventions  Self-care/ADL training;Therapeutic exercise;Patient/family education;Neuromuscular education;Aquatic Therapy;Moist Heat;Paraffin;Fluidtherapy;Energy conservation;Therapeutic activities;Balance training;Cognitive remediation/compensation;Passive range of motion;Manual Therapy;DME and/or AE instruction;Contrast Bath;Ultrasound;Cryotherapy;Functional Mobility Training    Plan  initiate HEP    Consulted and Agree with Plan of Care  Patient;Family member/caregiver    Family Member Consulted  wife only present at end of session       Patient will benefit from skilled therapeutic intervention in order to improve the following deficits and impairments:   Body Structure / Function / Physical Skills: ADL, Decreased knowledge of precautions, Flexibility, ROM, UE functional use, Mobility, FMC, Decreased knowledge of use of DME, Balance, Dexterity, Gait, GMC, Strength, Pain, Endurance, Coordination, IADL, Tone Cognitive Skills: Attention, Memory, Problem Solve, Safety Awareness, Sequencing, Thought     Visit Diagnosis: Other lack of coordination - Plan: Ot plan of  care cert/re-cert  Other abnormalities of gait and mobility - Plan: Ot plan of care cert/re-cert  Other symptoms and signs involving the musculoskeletal system - Plan: Ot plan of care cert/re-cert  Other symptoms and signs involving the nervous system - Plan: Ot plan of care cert/re-cert  Stiffness of left shoulder, not elsewhere classified - Plan: Ot plan of care cert/re-cert  Stiffness of right shoulder, not elsewhere classified - Plan: Ot plan of care cert/re-cert  Frontal lobe and executive function deficit - Plan: Ot plan of care cert/re-cert    Problem List Patient Active Problem List   Diagnosis Date Noted  . Proximal humerus fracture 11/18/2018  . Controlled substance agreement signed 04/27/2017  . Other insomnia 10/23/2016  . Anxiety disorder 10/08/2015  . Carotid artery stenosis, asymptomatic 10/08/2015  . Dyshydrosis 10/08/2015  . Essential hypertension 10/08/2015  . Hyperlipidemia LDL goal <100 10/08/2015  . Obesity (BMI 30-39.9) 10/08/2015  . Screening for prostate cancer 10/08/2015    Stehanie Ekstrom 05/01/2019, 1:33 PM Keene BreathKathryn Kipper Buch, OTR/L [phone] Phone: [phone] 1:33 PM 05/01/19 [facility] [address] [place], [place], [address] Phone: [phone]   Fax:  [phone]  Name: Brady PilotStephen [name] MRN: [name] Date of Birth: December 04, 1960

## 2019-05-02 NOTE — Progress Notes (Signed)
Subjective:   Patient ID: Brady [name], male   DOB: 58 y.o.   MRN: [id number]   HPI Patient states overall doing well with discomfort which continues to improve   ROS      Objective:  Physical Exam  Neurovascular status intact negative Homans sign noted with pins in place second third and fourth digits bilateral with good alignment and fixation and stitches well coapted     Assessment:  Doing well post digital fusion digits 234 both feet     Plan:  X-rays reviewed sterile dressings reapplied continue elevation compression immobilization reappoint 2 weeks or earlier if needed  X-rays indicate pins are in place second and third fourth toes both feet with good alignment noted

## 2019-05-03 ENCOUNTER — Telehealth: Payer: Self-pay | Admitting: Podiatry

## 2019-05-03 NOTE — Telephone Encounter (Signed)
Pt wanted to come in a little bit earlier for his 2nd post op. The appointment on Friday does not work for him. He would like to come Wednesday. Please call patient

## 2019-05-03 NOTE — Telephone Encounter (Signed)
I called pt and asked if he were having a problem that we had to get him in sooner. Pt states the appt on Friday does not work for his wife's schedule and would like to change to Wednesday. I told pt I would have the schedulers call and assist.

## 2019-05-04 ENCOUNTER — Other Ambulatory Visit: Payer: BC Managed Care – PPO

## 2019-05-04 ENCOUNTER — Telehealth: Payer: Self-pay | Admitting: Podiatry

## 2019-05-04 NOTE — Telephone Encounter (Signed)
This patient called the office concerned about the pins in his toes.  He says he has been wearing his surgical shoes and tripped when walking.  He says the pins in his toes have turned following his tripping incident. He says the pins are intact with no pain.  Reassured this patient there is no need to be concerned and he should keep his scheduled appointment.   He also had a question concerning his wifes surgery next Tuesday.  Told him to ask Dr.  Paulla Dolly directly.   Gardiner Barefoot DPM

## 2019-05-05 ENCOUNTER — Encounter (HOSPITAL_COMMUNITY)
Admission: RE | Admit: 2019-05-05 | Discharge: 2019-05-05 | Disposition: A | Discharge status: Home / Self Care | Payer: BC Managed Care – PPO | Source: Ambulatory Visit | Attending: Neurology | Admitting: Neurology

## 2019-05-05 ENCOUNTER — Other Ambulatory Visit: Payer: Self-pay

## 2019-05-05 DIAGNOSIS — W19XXXS Unspecified fall, sequela: Secondary | ICD-10-CM | POA: Insufficient documentation

## 2019-05-05 DIAGNOSIS — G20 Parkinson's disease: Secondary | ICD-10-CM | POA: Diagnosis present

## 2019-05-05 MED ORDER — IODINE STRONG (LUGOLS) 5 % PO SOLN
0.8000 mL | Freq: Once | ORAL | Status: AC
  Administered 2019-05-05: 11:00:00 0.8 mL via ORAL

## 2019-05-05 MED ORDER — IOFLUPANE I 123 185 MBQ/2.5ML IV SOLN
3.4000 | Freq: Once | INTRAVENOUS | Status: AC | PRN
  Administered 2019-05-05: 12:00:00 3.4 via INTRAVENOUS
  Filled 2019-05-05: qty 5

## 2019-05-05 MED ORDER — IODINE STRONG (LUGOLS) 5 % PO SOLN: 0.8000 mL | Freq: Once | ORAL | Status: DC

## 2019-05-05 NOTE — Progress Notes (Signed)
Please call patient regarding the recent nuclear medicine DaT scan result. As discussed, this is a specialized brain scan designed to help with diagnosis of tremor disorders, including parkinsonian disorders. A radioactive marker gets injected and the uptake is measured in the brain and compared to normal controls and right side is compared to the left. A change in uptake can help with diagnosis of certain tremor disorders and narrow down the diagnostic possibilities. 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 Again, this is not a definitive test for Parkinson's disease and does not distinguish between Parkinson's disease and other, atypical parkinsonism disorders, but overall, The test Findings, his history and examination are in keeping with an underlying parkinsonian syndrome. He has an appointment in 1 month, if he would like a sooner appointment, we can certainly look for a sooner opening. Please assist.

## 2019-05-09 ENCOUNTER — Telehealth: Payer: Self-pay | Admitting: Podiatry

## 2019-05-09 NOTE — Telephone Encounter (Signed)
This patient called saying he was experiencing pain in his toe due to a pin.  He says he was seen in the office and instructed on how to remove the bandage and bathe his foot.  He says he adjusted the wire and now experiences pain in the toe.  Told him his pin loosed and the motion can be causing his pain.  Told him the pain should resolve but he was told to call the office if pain persists.  Gardiner Barefoot DPM

## 2019-05-10 ENCOUNTER — Telehealth: Payer: Self-pay | Admitting: Neurology

## 2019-05-10 NOTE — Telephone Encounter (Signed)
I have called the pt and reviewed the results from 05/05/2019. Star Age, MD  Dinkins, Cyril Mourning, RN        Please call patient regarding the recent nuclear medicine DaT scan result. As discussed, this is a specialized brain scan designed to help with diagnosis of tremor disorders, including parkinsonian disorders. A radioactive marker gets injected and the uptake is measured in the brain and compared to normal controls and right side is compared to the left. A change in uptake can help with diagnosis of certain tremor disorders and narrow down the diagnostic possibilities. 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 Again, this is not a definitive test for Parkinson's disease and does not distinguish between Parkinson's disease and other, atypical parkinsonism disorders, but overall, The test Findings, his history and examination are in keeping with an underlying parkinsonian syndrome.  He has an appointment in 1 month, if he would like a sooner appointment, we can certainly look for a sooner opening. Please assist.   Result Notes for NM BRAIN DATSCAN TUMOR LOC INFLAM SPECT 1 DAY  Notes recorded by Star Age, MD on 05/05/2019 at 5:18 PM EDT  Please call patient regarding the recent nuclear medicine DaT scan result. As discussed, this is a specialized brain scan designed to help with diagnosis of tremor disorders, including parkinsonian disorders. A radioactive marker gets injected and the uptake is measured in the brain and compared to normal controls and right side is compared to the left. A change in uptake can help with diagnosis of certain tremor disorders and narrow down the diagnostic possibilities. The patient's recent scan indicated abnormal (as in lower) uptake as compared to normal uptake pattern indicating an underlying parkinsonian disorder  including possible atypical parkinsonism. Again, this is not a definitive test for Parkinson's disease and does not distinguish between Parkinson's disease and other, atypical parkinsonism disorders, but overall, The test Findings, his history and examination are in keeping with an underlying parkinsonian syndrome.  He has an appointment in 1 month, if he would like a sooner appointment, we can certainly look for a sooner opening. Please assist.    Pt declined to move appt up at this time but have placed his 06/08/2019 appt on the wait list if one does become available. Pt prefers afternoon.

## 2019-05-10 NOTE — Telephone Encounter (Signed)
Can you call in the result note from 05/05/19, thanks!

## 2019-05-10 NOTE — Telephone Encounter (Signed)
Patient calling for DAT scan results please call today if you can .

## 2019-05-11 ENCOUNTER — Ambulatory Visit (INDEPENDENT_AMBULATORY_CARE_PROVIDER_SITE_OTHER): Payer: BC Managed Care – PPO

## 2019-05-11 ENCOUNTER — Other Ambulatory Visit: Payer: Self-pay

## 2019-05-11 ENCOUNTER — Ambulatory Visit (INDEPENDENT_AMBULATORY_CARE_PROVIDER_SITE_OTHER): Payer: BC Managed Care – PPO | Admitting: Podiatry

## 2019-05-11 DIAGNOSIS — M2041 Other hammer toe(s) (acquired), right foot: Secondary | ICD-10-CM

## 2019-05-11 DIAGNOSIS — Z09 Encounter for follow-up examination after completed treatment for conditions other than malignant neoplasm: Secondary | ICD-10-CM

## 2019-05-11 DIAGNOSIS — M2042 Other hammer toe(s) (acquired), left foot: Secondary | ICD-10-CM

## 2019-05-11 NOTE — Progress Notes (Signed)
Subjective:   Patient ID: Brady [name], male   DOB: 58 y.o.   MRN: [id number]   HPI Patient states that his feet feel weak and is having a hard time moving around and he is waiting to get results from neurologist as to whether or not he has any issues from a neurological standpoint   ROS      Objective:  Physical Exam  Neurovascular status found to be intact negative Homans sign was noted with patient having pins in place 234 bilaterally good alignment and satisfactory positioning of the toes with stitches in place bilateral     Assessment:  Overall doing well post surgery with stitches in place and pins in place 234 bilateral     Plan:  H&P reviewed all conditions and recommended continued elevation compression and dressings as needed and stitches removed today with wound edges coapted will reappoint again in several weeks  X-rays indicate the pin fixation is as good as it could be with some rotation fourth digit right but clinically it looks good and I was always more concerned about the second and third digits bilaterally due to the distal keratotic tissue formation

## 2019-05-16 ENCOUNTER — Encounter: Payer: BC Managed Care – PPO | Admitting: Podiatry

## 2019-05-17 ENCOUNTER — Ambulatory Visit: Payer: BC Managed Care – PPO | Admitting: Physical Therapy

## 2019-05-17 ENCOUNTER — Ambulatory Visit: Payer: BC Managed Care – PPO | Admitting: Occupational Therapy

## 2019-05-17 ENCOUNTER — Telehealth: Payer: Self-pay | Admitting: Neurology

## 2019-05-17 NOTE — Telephone Encounter (Signed)
I called pt, offered him several sooner appts, but he declined. He asked to be kept on a waitlist for any other sooner appts.

## 2019-05-17 NOTE — Telephone Encounter (Signed)
Patient would like a sooner apt to go over DAT scan results. Please call.

## 2019-05-18 NOTE — Telephone Encounter (Signed)
I called pt, offered him an appt with Dr. Rexene Alberts tomorrow at :00pm, check in at 1:30 pm. Pt is agreeable to this appt. Pt verbalized understanding of new appt date and time.

## 2019-05-19 ENCOUNTER — Other Ambulatory Visit: Payer: Self-pay

## 2019-05-19 ENCOUNTER — Ambulatory Visit (INDEPENDENT_AMBULATORY_CARE_PROVIDER_SITE_OTHER): Payer: BC Managed Care – PPO | Admitting: Neurology

## 2019-05-19 ENCOUNTER — Encounter: Payer: Self-pay | Admitting: Neurology

## 2019-05-19 VITALS — BP 123/85 | HR 84

## 2019-05-19 DIAGNOSIS — G20 Parkinson's disease: Secondary | ICD-10-CM

## 2019-05-19 MED ORDER — CARBIDOPA-LEVODOPA 25-100 MG PO TABS: ORAL_TABLET | ORAL | 5 refills | Status: DC

## 2019-05-19 NOTE — Patient Instructions (Addendum)
I would like for you to start symptomatic medication for parkinsonism: Sinemet (generic name: carbidopa-levodopa) 25/100 mg: Take half a pill twice daily (8 AM and noon) for one week, then half a pill 3 times a day (8 AM, noon, and 4 PM) for one week, then one pill 3 times a day thereafter. Please try to take the medication away from you mealtimes, that is, ideally either one hour before or 2 hours after your meal to ensure optimal absorption. The medication can interfere with the protein content of your meal and trying to the protein in your food and therefore not get fully absorbed.  Common side effects reported are: Nausea, vomiting, sedation, confusion, lightheadedness. Rare side effects include hallucinations, severe nausea or vomiting, diarrhea and significant drop in blood pressure especially when going from lying to standing or from sitting to standing.   Hopefully, once the pins in your toes come out on each side, you can start walking little better.  Please follow-up with your psychiatrist for management of your anxiety and depression.

## 2019-05-19 NOTE — Progress Notes (Signed)
Subjective:    Patient ID: Brady [name] is a 58 y.o. male.  HPI     Interim history:   Brady [name] is a 58-year-old left-handed gentleman with an underlying medical history of hypertension, hyperlipidemia, anxiety, carotid artery stenosis, and obesity, he had an presents for follow-up consultation of his parkinsonism.  The patient is accompanied by his wife again today.  I first met him on 04/06/2019 at the request of his primary care physician, at which time he reported problems with his gait and balance starting in 2018.  He had a fall in December 2018 with injury.  He had more than one fall.  He reported a family history in his paternal aunt of Parkinson's disease.  I suggested we proceed with a DaTscan, his history and examination were concerning for parkinsonism.   He had a DaTscan in the interim on 05/05/2019 and I reviewed the results: IMPRESSION: Loss of radiotracer activity within the posterior striata and asymmetric decreased activity in the head of the LEFT caudate nucleus is a pattern typical of Parkinson's syndrome pathology.   We called him with his test results.  Of note, he was in the emergency room in the interim on 04/16/2019 for fecal impaction.   Today, 05/19/2019: He reports not being able to ambulate well.  He had interim foot surgery bilaterally for hammertoes in August and was hoping to get the pins removed by 3 to 4 weeks but he still has the pins for a total of 5 weeks his understand.  He has an appointment next week to get the pins removed.  He has boots on both feet and has been using a 2 wheeled walker at the house.  Thankfully no falls.  He does feel like his mobility has declined overall.  He has ongoing issues with numbness in the right leg.  He had injection into his lower back in the past.  He also reports flareup of his anxiety and depression.  He is followed by a psychiatrist for this.  He is on Zoloft and BuSpar.  He also takes as needed Xanax.  He is trying  to hydrate well.  The patient's allergies, current medications, family history, past medical history, past social history, past surgical history and problem list were reviewed and updated as appropriate.   Previously:  04/06/2019: (He) reports problems with his balance that started in 2018.  He had a fall in December and injured his right scapula at the time.  He was having injections into his lower back secondary to right-sided sciatica.  He had noticed over time changes in his gait.  His wife noticed changes in his posture and gait in 2018.  In March 2020 he fell and injured his left scapula and had a fractured right humerus which needed surgery.  He has overtime noticed shorter steps, changes in his handwriting, lack of energy.  He does not sleep very well at night.  He reports that his father passed away in November 2019 with dementia, he lived to be 59 years old.  His paternal aunt reportedly had Parkinson's disease.   He lives with his wife, they have no children.  They have no pets in the house.  He works as a licensed optician. I reviewed your office note from 03/09/2019.  He has noted a tremor in his left hand and leg since about June 2020.  He has noticed it intermittently, he has noticed shuffling of his gait and stutter steps.  His anxiety had increased.    Of note, he is on alprazolam 0.25 mg twice daily as needed, he is also on BuSpar 10 mg twice daily, Cymbalta 60 mg daily.  He had a recent thyroid function test in your office.  We will request test results from your office. He had a cervical spine CT without contrast as well as head CT without contrast on 11/16/2018 with indication of fall and I reviewed the results: IMPRESSION: No acute intracranial abnormality. No acute bony abnormality in the cervical spine. He is a non-smoker, he does not currently drink any alcohol, drinks caffeine in the form of soda, about 16 ounce every other day or so.  He has had constipation, in fact, in October 2019  he had to go to the emergency room with obstipation, requiring disimpaction.  His Past Medical History Is Significant For: Past Medical History:  Diagnosis Date  . Anxiety   . Carotid artery stenosis   . Dyshydrosis   . Hyperlipidemia   . Hypertension     His Past Surgical History Is Significant For: Past Surgical History:  Procedure Laterality Date  . ORIF HUMERUS FRACTURE Right 11/18/2018   Procedure: OPEN REDUCTION INTERNAL FIXATION (ORIF) PROXIMAL HUMERUS FRACTURE;  Surgeon: Justice Britain, MD;  Location: [facility];  Service: Orthopedics;  Laterality: Right;    His Family History Is Significant For: History reviewed. No pertinent family history.  His Social History Is Significant For: Social History   Socioeconomic History  . Marital status: Married    Spouse name: Not on file  . Number of children: Not on file  . Years of education: Not on file  . Highest education level: Not on file  Occupational History  . Not on file  Social Needs  . Financial resource strain: Not on file  . Food insecurity    Worry: Not on file    Inability: Not on file  . Transportation needs    Medical: Not on file    Non-medical: Not on file  Tobacco Use  . Smoking status: Never Smoker  . Smokeless tobacco: Never Used  Substance and Sexual Activity  . Alcohol use: Not Currently  . Drug use: Not Currently  . Sexual activity: Not on file  Lifestyle  . Physical activity    Days per week: Not on file    Minutes per session: Not on file  . Stress: Not on file  Relationships  . Social Herbalist on phone: Not on file    Gets together: Not on file    Attends religious service: Not on file    Active member of club or organization: Not on file    Attends meetings of clubs or organizations: Not on file    Relationship status: Not on file  Other Topics Concern  . Not on file  Social History Narrative  . Not on file    His Allergies Are:  No Known Allergies:   His Current  Medications Are:  Outpatient Encounter Medications as of 05/19/2019  Medication Sig  . ALPRAZolam (XANAX) 0.25 MG tablet Take 0.25 mg by mouth at bedtime as needed for anxiety.  Marland Kitchen aspirin 81 MG chewable tablet Chew 81 mg by mouth daily.   . busPIRone (BUSPAR) 10 MG tablet Take 10 mg by mouth 2 (two) times daily.   Marland Kitchen docusate sodium (COLACE) 100 MG capsule Take 1 capsule (100 mg total) by mouth every 12 (twelve) hours.  Marland Kitchen lisinopril (PRINIVIL,ZESTRIL) 20 MG tablet Take 20 mg by mouth daily.   Marland Kitchen  lovastatin (MEVACOR) 20 MG tablet Take 20 mg by mouth daily.   . Multiple Vitamin (MULTI-VITAMINS) TABS Take 1 tablet by mouth daily.   . sertraline (ZOLOFT) 100 MG tablet 50 mg.   . carbidopa-levodopa (SINEMET IR) 25-100 MG tablet Take 1/2 pill twice daily x 1 week, then 1/2 pill 3 times a day x 1 week, then 1 pill 3 times a day thereafter.  . [DISCONTINUED] DULoxetine (CYMBALTA) 60 MG capsule Take 1 capsule by mouth at bedtime.   . [DISCONTINUED] polyethylene glycol (MIRALAX) 17 g packet Take 17 g by mouth daily.   No facility-administered encounter medications on file as of 05/19/2019.   :  Review of Systems:  Out of a complete 14 point review of systems, all are reviewed and negative with the exception of these symptoms as listed below: Review of Systems  Neurological:       Pt presents today to discuss his DaT scan results. Pt feels that his ambulation is worsening. Pt recently had surgery on his feet.    Objective:  Neurological Exam  Physical Exam Physical Examination:   Vitals:   05/19/19 1413  BP: 123/85  Pulse: 84    General Examination: The patient is a very pleasant 58 y.o. male in no acute distress. He appears well-developed and well-nourished and well groomed. He is situated in a wheelchair.  He is anxious, both feet are in a ankle boot and he brought a 2 wheeled walker.   HEENT: Normocephalic, atraumatic, pupils are equal, round and reactive to light and accommodation. He  wears corrective eyeglasses.  Extraocular tracking shows mild saccadic breakdown, he has slight limitation to upgaze and downgaze.  He has no nystagmus.  He has moderate facial masking.  He has no obvious sialorrhea.  Hearing is grossly intact.  He has moderate nuchal rigidity.  He has no lip, neck or jaw tremor.  He has mild hypophonia, no obvious dysarthria, airway examination with dryness, and moderate airway crowding noted.  Chest: is clear to auscultation without wheezing, rhonchi or crackles noted.  Heart: sounds are regular and normal without murmurs, rubs or gallops noted.   Abdomen: is soft, non-tender and non-distended with normal bowel sounds appreciated on auscultation.  Extremities: There is no pitting edema in the distal lower extremities bilaterally.   Skin: is warm and dry with no trophic changes noted.    Musculoskeletal: exam reveals Decreased range of motion in both shoulders.  Neurologically:  Mental status: The patient is awake and alert, paying good  attention. He is able to provide the history. His wife provides details. There is a mild degree of bradyphrenia. Speech is mildly hypophonic with no dysarthria noted. Mood is congruent and affect is constricted and anxious.  (On 04/06/2019: On Archimedes spiral drawing he has no significant trembling with the left hand which is his dominant hand, slight insecurity with the right hand, handwriting with the left hand is legible, slightly tremulous, not particularly micrographic.)  Cranial nerves are as described above under HEENT exam. In addition, shoulder shrug is normal with equal shoulder height noted.  Motor exam: Normal bulk, and strength for age is noted.  Tone is moderately rigid with presence of cogwheeling in the Upper extremities bilaterally, right side worse. He has moderate bradykinesia.  He has an intermittent no resting tremor today. He has no significant postural or action tremor.  He has no intention  tremor.  Fine motor skills are impaired in the moderate range in the upper and lower extremities  bilaterally, no telltale lateralization noted. Difficult to assess foot agility and foot taps today because of the feet being bandaged and in boots.   Reflexes are 1+ in the upper extremities and 1+ in the lower extremities, trace R patellar.   Cerebellar testing shows no dysmetria or intention tremor.   Sensory exam is intact to light touch.   Gait, station and balance: I did not ask him to walk for me today because of his recent foot surgeries.   Assessment and Plan:   In summary, Traveon Louro is a very pleasant 68 -year old male with an underlying medical history of hypertension, hyperlipidemia, anxiety, carotid artery stenosis, Obesity, sciatica,, hammertoes with status post surgeries to both feet in August 2020, status post lower back injection, who presents for follow-up consultation of his parkinsonism.  He has had symptoms since 2018, he has had falls.  He has developed intermittent tremors, fine motor difficulties, balance issues, gait disorder. His history and examination are concerning for parkinsonism,Idiopathic Parkinson's disease versus atypical parkinsonism.  He has had significant constipation and obstipation.  His DaTscan on 9/320 showed loss of radiotracer activity within the posterior striata and asymmetric decreased activity in the head of the LEFT caudate nucleus.   I had referred him to neuro rehab but this is currently on hold because of his interim foot surgeries.  I suggested we proceed with symptomatic treatment with Sinemet.  He is advised to titrate this gradually.  I provided written instructions and a new prescription.  He is advised to follow-up with his psychiatrist for ongoing management of his anxiety and depression.  He is visibly anxious today. I advised him to follow-up in 3 months, sooner if needed.  I answered all their questions today and the patient and  his wife were in agreement.  I spent 25 minutes in total face-to-face time with the patient, more than 50% of which was spent in counseling and coordination of care, reviewing test results, reviewing medication and discussing or reviewing the diagnosis of parkinsonism, the prognosis and treatment options. Pertinent laboratory and imaging test results that were available during this visit with the patient were reviewed by me and considered in my medical decision making (see chart for details).

## 2019-05-23 ENCOUNTER — Ambulatory Visit (INDEPENDENT_AMBULATORY_CARE_PROVIDER_SITE_OTHER): Payer: BC Managed Care – PPO

## 2019-05-23 ENCOUNTER — Other Ambulatory Visit: Payer: Self-pay

## 2019-05-23 ENCOUNTER — Encounter: Payer: Self-pay | Admitting: Podiatry

## 2019-05-23 ENCOUNTER — Encounter: Payer: BC Managed Care – PPO | Admitting: Podiatry

## 2019-05-23 ENCOUNTER — Ambulatory Visit (INDEPENDENT_AMBULATORY_CARE_PROVIDER_SITE_OTHER): Payer: BC Managed Care – PPO | Admitting: Podiatry

## 2019-05-23 DIAGNOSIS — M2042 Other hammer toe(s) (acquired), left foot: Secondary | ICD-10-CM | POA: Diagnosis not present

## 2019-05-23 DIAGNOSIS — M2041 Other hammer toe(s) (acquired), right foot: Secondary | ICD-10-CM

## 2019-05-23 DIAGNOSIS — Z09 Encounter for follow-up examination after completed treatment for conditions other than malignant neoplasm: Secondary | ICD-10-CM

## 2019-05-23 NOTE — Progress Notes (Signed)
Subjective:   Patient ID: Brady [name], male   DOB: 58 y.o.   MRN: [id number]   HPI Patient states doing pretty good with my toes ready to get these pins removed   ROS      Objective:  Physical Exam  Neurovascular status found to be intact with pins in place digits 234 bilateral with wound edges well coapted and good alignment of the digits     Assessment:  Overall doing well after having fusion of digits 234 both feet     Plan:  H&P reviewed condition removed pins applied sterile dressings and instructed on continued elevation compression immobilization reappoint 4 weeks or earlier if needed  X-rays indicate the fusion sites look good with good alignment noted

## 2019-05-24 ENCOUNTER — Ambulatory Visit: Payer: BC Managed Care – PPO | Admitting: Physical Therapy

## 2019-05-24 ENCOUNTER — Ambulatory Visit: Payer: BC Managed Care – PPO | Admitting: Occupational Therapy

## 2019-05-27 ENCOUNTER — Telehealth: Payer: Self-pay | Admitting: *Deleted

## 2019-05-27 NOTE — Telephone Encounter (Signed)
-----   Message from Clarene Reamer sent at 05/27/2019 12:41 PM EDT ----- Regarding: Pt needs call Pt concerns that he doesn't know what to do after surgery -he is needing a call to help him Thanks Amy

## 2019-05-27 NOTE — Telephone Encounter (Signed)
Pt asked if he needed to wear the ace wrap at all times and if he could wash his feet. I told pt that he should apply the ace wrap to the surgery foot before getting out of bed, to help hold the swelling out of his feet and train them not to swell, to wash his feet in the shower or birdbath rinsing well after and pat dry, no soaking.

## 2019-05-30 ENCOUNTER — Telehealth: Payer: Self-pay | Admitting: Podiatry

## 2019-05-30 ENCOUNTER — Ambulatory Visit: Payer: BC Managed Care – PPO | Admitting: Neurology

## 2019-05-30 ENCOUNTER — Encounter

## 2019-05-30 NOTE — Telephone Encounter (Signed)
Patient called and stated that his sx foot has been dry and that the toenails are discolored. Per Val could not put lotion on the toes and if needed lotion on dry area it could be 1 inch away from incision. Pt would like toenail trimming when coming in for the next post op.

## 2019-06-02 ENCOUNTER — Ambulatory Visit: Payer: BC Managed Care – PPO | Admitting: Occupational Therapy

## 2019-06-02 ENCOUNTER — Ambulatory Visit: Payer: BC Managed Care – PPO | Attending: Neurology | Admitting: Physical Therapy

## 2019-06-02 ENCOUNTER — Other Ambulatory Visit: Payer: Self-pay

## 2019-06-02 DIAGNOSIS — M25611 Stiffness of right shoulder, not elsewhere classified: Secondary | ICD-10-CM | POA: Diagnosis present

## 2019-06-02 DIAGNOSIS — R41844 Frontal lobe and executive function deficit: Secondary | ICD-10-CM | POA: Diagnosis present

## 2019-06-02 DIAGNOSIS — R2689 Other abnormalities of gait and mobility: Secondary | ICD-10-CM | POA: Insufficient documentation

## 2019-06-02 DIAGNOSIS — R278 Other lack of coordination: Secondary | ICD-10-CM

## 2019-06-02 DIAGNOSIS — M25612 Stiffness of left shoulder, not elsewhere classified: Secondary | ICD-10-CM | POA: Insufficient documentation

## 2019-06-02 DIAGNOSIS — R29898 Other symptoms and signs involving the musculoskeletal system: Secondary | ICD-10-CM | POA: Insufficient documentation

## 2019-06-02 DIAGNOSIS — R29818 Other symptoms and signs involving the nervous system: Secondary | ICD-10-CM | POA: Insufficient documentation

## 2019-06-02 DIAGNOSIS — R2681 Unsteadiness on feet: Secondary | ICD-10-CM | POA: Diagnosis present

## 2019-06-02 DIAGNOSIS — M6281 Muscle weakness (generalized): Secondary | ICD-10-CM

## 2019-06-02 NOTE — Therapy (Signed)
[facility] [address] [place], [place], [address] Phone: [phone]   Fax:  [phone]  Occupational Therapy Treatment  Patient Details  Name: Brady PilotStephen [name] MRN: [id number] Date of Birth: [birth year] Referring Provider (OT): Dr. Frances FurbishAthar   Encounter Date: 06/02/2019  OT End of Session - 06/02/19 0939    Visit Number  2    Number of Visits  13    Date for OT Re-Evaluation  07/30/19    Authorization Type  BCBS    Authorization - Visit Number  2   12/30 visits used previously   Authorization - Number of Visits  13    OT Start Time  [phone]    OT Stop Time  1014    OT Time Calculation (min)  38 min    Activity Tolerance  Patient tolerated treatment well    Behavior During Therapy  Restless;Impulsive       Past Medical History:  Diagnosis Date  . Anxiety   . Carotid artery stenosis   . Dyshydrosis   . Hyperlipidemia   . Hypertension     Past Surgical History:  Procedure Laterality Date  . ORIF HUMERUS FRACTURE Right 11/18/2018   Procedure: OPEN REDUCTION INTERNAL FIXATION (ORIF) PROXIMAL HUMERUS FRACTURE;  Surgeon: Francena HanlySupple, Kevin, MD;  Location: [facility];  Service: Orthopedics;  Laterality: Right;    There were no vitals filed for this visit.  Subjective Assessment - 06/02/19 0938    Subjective   Pins were removed on 05/23/19    Pertinent History  Parkinsonism,L scapular fx and right humeral fx in March 2020-no precautions, hammertoe surgery    Patient Stated Goals  maximize independence    Currently in Pain?  No/denies                           OT Education - 06/02/19 1058    Education Details  coordination HEP, PWR! hands, mod v.c for larger amplitude movements and performance.    Person(s) Educated  Patient;Spouse    Methods  Explanation;Demonstration;Verbal cues;Handout    Comprehension  Verbalized understanding;Returned demonstration;Verbal cues required;Tactile cues required        OT Short Term Goals - 05/01/19 1313      OT SHORT TERM GOAL #1   Title  I with HEP.    Time  4    Period  Weeks    Status  New      OT SHORT TERM GOAL #2   Title  Pt will demonstrate understanding of adapted strategies to maximize safety and independence with ADLs/IADLs.    Time  4    Period  Weeks    Status  New      OT SHORT TERM GOAL #3   Title  Pt will demonstrate ability to retrieve a lightweight object with RUE at 100 shoulder flexion with -15 elbow extension    Baseline  90, -20    Time  4    Period  Weeks    Status  New      OT SHORT TERM GOAL #4   Title  Pt will demonstrate ability to retrieve a lightweight object with LUE at 125 shoulder flexion and -15 elbow ext.    Baseline  120, -20    Time  4    Period  Weeks    Status  New      OT SHORT TERM GOAL #5   Title  Pt will demonstrate ability  to write a short paragraph with no significant decrease in letter size and 100% legibility.    Time  4    Period  Weeks    Status  New        OT Long Term Goals - 05/01/19 1320      OT LONG TERM GOAL #1   Title  Pt will verbalize understanding of ways to prevent future PD related complication and community resources.    Time  12    Period  Weeks    Status  New    Target Date  07/30/19      OT LONG TERM GOAL #2   Title  Pt will demonstrate improved ease with dressing as evidenced by decreasing PPT#4 to 25 secs or less    Baseline  30.28 secs    Time  12    Period  Weeks    Status  New      OT LONG TERM GOAL #3   Title  Pt will demonstrate increased ease with fastening buttons as evidenced by decreasing 3 button/ unbutton to 75 secs or less    Baseline  84 secs    Time  12    Period  Weeks    Status  New      OT LONG TERM GOAL #4   Title  Pt will demonstrate improved bilateral UE functional use as evidenced by increasing bilateral box/ blocks score by 3 blocks.    Baseline  RUE 43, LUE 44    Time  12    Period  Weeks    Status  New             Plan - 06/02/19 0944    Clinical Impression Statement  Pt is progressing towards goals  Pt wife verbalize understanding of inital coordination HEP and PWR! hands    OT Occupational Profile and History  Detailed Assessment- Review of Records and additional review of physical, cognitive, psychosocial history related to current functional performance    Occupational performance deficits (Please refer to evaluation for details):  ADL's;IADL's;Work;Leisure;Social Participation    Body Structure / Function / Physical Skills  ADL;Decreased knowledge of precautions;Flexibility;ROM;UE functional use;Mobility;FMC;Decreased knowledge of use of DME;Balance;Dexterity;Gait;GMC;Strength;Pain;Endurance;Coordination;IADL;Tone    Cognitive Skills  Attention;Memory;Problem Solve;Safety Awareness;Sequencing;Thought    Rehab Potential  Good    Clinical Decision Making  Several treatment options, min-mod task modification necessary    Comorbidities Affecting Occupational Performance:  May have comorbidities impacting occupational performance    Modification or Assistance to Complete Evaluation   Min-Moderate modification of tasks or assist with assess necessary to complete eval   extra time, verbal cues   OT Frequency  1x / week    OT Duration  12 weeks   plus eval, may d/c after 8 weeks   OT Treatment/Interventions  Self-care/ADL training;Therapeutic exercise;Patient/family education;Neuromuscular education;Aquatic Therapy;Moist Heat;Paraffin;Fluidtherapy;Energy conservation;Therapeutic activities;Balance training;Cognitive remediation/compensation;Passive range of motion;Manual Therapy;DME and/or AE instruction;Contrast Bath;Ultrasound;Cryotherapy;Functional Mobility Training    Plan  initiate supine PWR! vs. seated PWR! ADL stretegies    Consulted and Agree with Plan of Care  Patient;Family member/caregiver    Family Member Consulted  wife       Patient will benefit from skilled therapeutic  intervention in order to improve the following deficits and impairments:   Body Structure / Function / Physical Skills: ADL, Decreased knowledge of precautions, Flexibility, ROM, UE functional use, Mobility, FMC, Decreased knowledge of use of DME, Balance, Dexterity, Gait, GMC, Strength, Pain, Endurance, Coordination, IADL, Tone Cognitive Skills:  Attention, Memory, Problem Solve, Safety Awareness, Sequencing, Thought     Visit Diagnosis: Other lack of coordination  Other symptoms and signs involving the musculoskeletal system  Other symptoms and signs involving the nervous system  Stiffness of left shoulder, not elsewhere classified  Stiffness of right shoulder, not elsewhere classified  Frontal lobe and executive function deficit  Muscle weakness (generalized)    Problem List Patient Active Problem List   Diagnosis Date Noted  . Proximal humerus fracture 11/18/2018  . Controlled substance agreement signed 04/27/2017  . Other insomnia 10/23/2016  . Anxiety disorder 10/08/2015  . Carotid artery stenosis, asymptomatic 10/08/2015  . Dyshydrosis 10/08/2015  . Essential hypertension 10/08/2015  . Hyperlipidemia LDL goal <100 10/08/2015  . Obesity (BMI 30-39.9) 10/08/2015  . Screening for prostate cancer 10/08/2015    RINE,KATHRYN 06/02/2019, 11:02 AM  [facility] [address] [place], [place], [address] Phone: [phone]   Fax:  [phone]  Name: Giovoni Bunch MRN: [id number] Date of Birth: 11-28-1960

## 2019-06-02 NOTE — Patient Instructions (Addendum)
HIP / KNEE: Extension - Sit to Stand    Sitting, lean chest forward, raise hips up from surface. Stand up and straighten hips and knees. Weight bear equally on left and right sides. Backs of legs should not push off surface.  Stand tall for 3 seconds.  When you sit back down, back up fully to the chair, and reach back.  Stick out your bottom and squat SLOWLY to sit down in a controlled way.  (NO PLOPPING!) __10_ reps per set, _1-2__ sets per day. Copyright  VHI. All rights reserved.  ANKLE: Pumps    Sitting at edge of the chair.  Point toes down, then up, like pumping your ankles.  Hold each position for 3 seconds. __10_ reps per set, _2__ sets per day  Copyright  VHI. All rights reserved.    Call [facility] and ask about front wheels for your standard walker.

## 2019-06-02 NOTE — Patient Instructions (Signed)
PWR! Hands  With arms stretched out in front of you (elbows straight), perform the following:  PWR! Rock: Move wrists up and down [employer]! Twist: Twist palms up and down BIG  Then, start with elbows bent and hands closed.  PWR! Step: Touch index finger to thumb while keeping other fingers straight. Flick fingers out BIG (thumb out/straighten fingers). Repeat with other fingers. (Step your thumb to each finger).  PWR! Hands: Push hands out BIG. Elbows straight, wrists up, fingers open and spread apart BIG. (Can also perform by pushing down on table, chair, knees. Push above head, out to the side, behind you, in front of you.)    Coordination Exercises  Perform the following exercises for 20 minutes 1 times per day. Perform with both hand(s). Perform using big movements.   Flipping Cards: Place deck of cards on the table. Flip cards over by opening your hand big to grasp and then turn your palm up big.  Deal cards: Hold 1/2 or whole deck in your hand. Use thumb to push card off top of deck with one big push.  Rotate ball with fingertips: Pick up with fingers/thumb and move as much as you can with each turn/movement (clockwise and counter-clockwise).  Pick up coins and place in coin bank or container: Pick up with big, intentional movements. Do not drag coin to the edge.  Pick up coins and stack one at a time: Pick up with big, intentional movements. Do not drag coin to the edge. (5-10 in a stack)  Pick up 5-10 coins one at a time and hold in palm. Then, move coins from palm to fingertips one at time and place in coin bank/container.  Perform "Flicks"/hand stretches (PWR! Hands): Close hands then flick out your fingers with focus on opening hands, pulling wrists back, and extending elbows like you are pushing.   ** Make each movement big and deliberate so that you feel the movement.  Perform at least 10 repetitions 1x/day, but perform PWR! hands throughout the day when you are  having trouble using your hands (picking up/manipulating small objects, writing, eating, typing, sewing, buttoning, etc.).

## 2019-06-03 NOTE — Therapy (Signed)
[facility] [address] [place], [place], [address] Phone: [phone]   Fax:  [phone]  Physical Therapy Treatment  Patient Details  Name: Brady PilotStephen [name] MRN: [id number] Date of Birth: [birth year] Referring Provider (PT): Dr. Frances FurbishAthar   Encounter Date: 06/02/2019  PT End of Session - 06/03/19 1007    Visit Number  2    Number of Visits  18   per recert 06/02/2019   Date for PT Re-Evaluation  08/31/19    Authorization Type  BCBS: 30 visit limit, 12 used for ortho PT (s/p ORIF), OT and PT count as 1 visit if on same day per chart.    Authorization - Visit Number  14    Authorization - Number of Visits  30    PT Start Time  1016    PT Stop Time  1101    PT Time Calculation (min)  45 min    Equipment Utilized During Treatment  Other (comment)   min guard to S prn   Activity Tolerance  Patient tolerated treatment well    Behavior During Therapy  Anxious       Past Medical History:  Diagnosis Date  . Anxiety   . Carotid artery stenosis   . Dyshydrosis   . Hyperlipidemia   . Hypertension     Past Surgical History:  Procedure Laterality Date  . ORIF HUMERUS FRACTURE Right 11/18/2018   Procedure: OPEN REDUCTION INTERNAL FIXATION (ORIF) PROXIMAL HUMERUS FRACTURE;  Surgeon: Francena HanlySupple, Kevin, MD;  Location: [facility];  Service: Orthopedics;  Laterality: Right;    There were no vitals filed for this visit.  Subjective Assessment - 06/02/19 1020    Subjective  Got the pins out of feet 9/21, no restrictions and no precautions per patient report.  No falls since March.  Using cane-it's easier to walk without the walker.    Patient is accompained by:  Family member   Brady [name]-wife   Pertinent History  HTN, HLD, anxiety, carotid stenosis, obesity, hx of R humerus ORIF and L scapula fx due to 3/20 fall, hammer toe surgery on 04/19/19 (digits 2-4)    Patient Stated Goals  Improve strength, improve endurance, improve balance (Zoloft  made balance worse)    Currently in Pain?  No/denies         [facility] - 06/03/19 0957      Transfers   Sit to Stand  5: Supervision;Without upper extremity assist;From chair/3-in-1    Stand to Sit  5: Supervision;Without upper extremity assist;To chair/3-in-1      Timed Up and Go Test   TUG  Normal TUG;Cognitive TUG    Normal TUG (seconds)  20.03    Cognitive TUG (seconds)  21.91                   [facility] Adult PT Treatment/Exercise - 06/03/19 0957      Transfers   Transfers  Sit to Stand;Stand to Sit    Stand to Sit Details  Cues provided for slowed, controlled technique into sitting    Number of Reps  2 sets;Other sets (comment)   5     Ambulation/Gait   Ambulation/Gait  Yes    Ambulation/Gait Assistance  4: Min guard    Ambulation/Gait Assistance Details  With cane use, PT provides verbal, visual cues and hand over hand instruction for sequence of cane; pt has difficulty maintaining sequence and walks with a step-to pattern with cane.    Ambulation Distance (  Feet)  115 Feet   x 2 with cane; 115 ft x 2 with RW   Assistive device  Straight cane;Rolling walker    Gait Pattern  Step-to pattern;Step-through pattern;Decreased stride length;Decreased dorsiflexion - left;Decreased dorsiflexion - right;Decreased hip/knee flexion - right;Decreased hip/knee flexion - left;Shuffle;Decreased step length - right;Decreased step length - left;Narrow base of support;Poor foot clearance - left;Poor foot clearance - right    Ambulation Surface  Level;Indoor    Gait velocity  16.44 sec = 2 ft/sec    Gait Comments  With RW, pt able to improve foot clearance, step length, heelstrike, but still needs cues for initiation of gait to avoid shuffling to start.  Explained to pt (and wife at end of session) to call [place] (where he got his SW) to ask about the wheels for his walker. Explained the benefit of using the RW is to improve safety, stability, technique with gait.       High Level Balance   High Level Balance Comments  Standing at counter with forward step and weigthshift, x 10 reps each side, emphasis on foot clearance, "kick", heelstrike, then lift to return to midline.  Cues for technique, for increased amplitude of movement pattern.      Exercises   Exercises  Ankle      Ankle Exercises: Seated   Ankle Circles/Pumps  AROM;Strengthening;Both;10 reps   2 sets, ankle pumps            PT Education - 06/03/19 1006    Education Details  Initiated HEP; benefits of using RW versus cane or SW; educated patient and wife how to obtain wheels for [employer]) Educated  Patient;Spouse   wife at end of session   Methods  Explanation;Demonstration;Verbal cues;Handout    Comprehension  Verbalized understanding;Returned demonstration;Verbal cues required;Need further instruction       PT Short Term Goals - 06/03/19 1009      PT SHORT TERM GOAL #1   Title  Perform FGA or Mini BESTest and write goals as indicated. TARGET DATE FOR ALL STGS: 07/01/19    Time  4    Period  Weeks    Status  On-going      PT SHORT TERM GOAL #2   Title  Pt will be perform HEP with S from wife to improve strength, balance, and gait.    Time  4    Period  Weeks    Status  On-going      PT SHORT TERM GOAL #3   Title  Pt will perform normal TUG with LRAD in </=15 sec. to decr. falls risk.    Time  4    Status  Revised      PT SHORT TERM GOAL #4   Title  Pt will improve gait speed with LRAD to >/=2.54ft/sec. to safely amb. in the community.    Time  4    Period  Weeks    Status  On-going      PT SHORT TERM GOAL #5   Title  Pt will amb. 500' with LRAD at MOD I level, over even terrain to improve functional mobility.    Time  4    Period  Weeks    Status  On-going        PT Long Term Goals - 06/03/19 1010      PT LONG TERM GOAL #1   Title  Pt will improve cognitive TUG time with LRAD to </=15 sec. to decr.  falls risk. TARGET DATE FOR ALL LTGS:  07/29/2019    Time  8    Period  Weeks    Status  On-going      PT LONG TERM GOAL #2   Title  Pt will amb. 1000' over paved surfaces with LRAD at MOD I level    Time  8    Period  Weeks    Status  On-going      PT LONG TERM GOAL #3   Title  Pt will be IND in progressed HEP to improve strength, balance, and gait.    Time  8    Period  Weeks    Status  On-going      PT LONG TERM GOAL #4   Title  Pt will report no falls over last four weeks to improve safety.    Time  8    Period  Weeks    Status  On-going      PT LONG TERM GOAL #5   Title  Pt will perform 8 of 10 reps of sit<>stand with proper technique, minimal UE use, no LOB, for improved transfer safety and efficiency.    Time  8    Period  Weeks    Status  New            Plan - 06/03/19 1012    Clinical Impression Statement  Pt returns 06/02/2019 to PT after evaluation 05/30/2019; he has not been to therapy due to awaiting pin removal from bilateral hammer toe surgery.  Pin removal was 05/23/2019 and pt reports not having any restrictions or precautions.  Measured functional measures today with pt remaining a fall risk per TUG, TUG cognitive and gait velocity scores.  PT did not perform FGA or MiniBESTest this visit, as PT initiated treatment and recommendations on use of rolling walker for optimal gait safety and technique.  Pt will continue to benefit from skilled PT to address prior stated deficits on eval.  Note ongoing/modified goals, with recert completed to reflect possible addition to [place] for PT.    Personal Factors and Comorbidities  Comorbidity 3+;Behavior Pattern    Comorbidities  see above    Examination-Activity Limitations  Carry;Locomotion Level;Stand;Lift    Examination-Participation Restrictions  Other   work-currently on short-term disability   Stability/Clinical Decision Making  Evolving/Moderate complexity    Rehab Potential  Good    PT Frequency  2x / week   per recert 40/05/8118; will need to monitor  visit limit   PT Duration  8 weeks    PT Treatment/Interventions  ADLs/Self Care Home Management;Electrical Stimulation;DME Instruction;Gait training;Stair training;Functional mobility training;Therapeutic activities;Therapeutic exercise;Balance training;Orthotic Fit/Training;Patient/family education;Cognitive remediation;Neuromuscular re-education;Manual techniques;Vestibular    PT Next Visit Plan  Perform Berg score versus MiniBEST/FGA, and write goal as indicated.  Check to see if pt got wheels for RW.  Review HEP initiated this visit and continue to work on exercises to address large amplitude stepping, weigthshifting for improved foot clearance with gait (?Try PWR! Moves standing); need to try to add additional appts and see about increaseing frequency to 2x/wk    Consulted and Agree with Plan of Care  Patient;Family member/caregiver    Family Member Consulted  wife: Brady [name]       Patient will benefit from skilled therapeutic intervention in order to improve the following deficits and impairments:  Abnormal gait, Decreased coordination, Decreased range of motion, Difficulty walking, Impaired tone, Decreased endurance, Decreased knowledge of use of DME, Decreased balance, Decreased mobility, Decreased cognition,  Decreased strength, Impaired sensation, Postural dysfunction, Impaired flexibility, Pain(pt reported pain in foot 2/2 surgery during gait, PT will monitor closely but not directly treat)  Visit Diagnosis: Other abnormalities of gait and mobility  Unsteadiness on feet  Muscle weakness (generalized)  Other symptoms and signs involving the nervous system     Problem List Patient Active Problem List   Diagnosis Date Noted  . Proximal humerus fracture 11/18/2018  . Controlled substance agreement signed 04/27/2017  . Other insomnia 10/23/2016  . Anxiety disorder 10/08/2015  . Carotid artery stenosis, asymptomatic 10/08/2015  . Dyshydrosis 10/08/2015  . Essential hypertension  10/08/2015  . Hyperlipidemia LDL goal <100 10/08/2015  . Obesity (BMI 30-39.9) 10/08/2015  . Screening for prostate cancer 10/08/2015    Brady Maidens. 06/03/2019, 2:22 PM  Brady Maidens., PT   [facility] [address] [place], [place], [address] Phone: [phone]   Fax:  [phone]  Name: Brady [name] MRN: [id number] Date of Birth: 06-26-1961

## 2019-06-07 ENCOUNTER — Other Ambulatory Visit: Payer: Self-pay

## 2019-06-07 ENCOUNTER — Ambulatory Visit: Payer: BC Managed Care – PPO | Admitting: Occupational Therapy

## 2019-06-07 ENCOUNTER — Encounter: Payer: Self-pay | Admitting: Occupational Therapy

## 2019-06-07 ENCOUNTER — Ambulatory Visit: Payer: BC Managed Care – PPO | Admitting: Physical Therapy

## 2019-06-07 ENCOUNTER — Encounter: Payer: Self-pay | Admitting: Physical Therapy

## 2019-06-07 DIAGNOSIS — R2681 Unsteadiness on feet: Secondary | ICD-10-CM

## 2019-06-07 DIAGNOSIS — R2689 Other abnormalities of gait and mobility: Secondary | ICD-10-CM

## 2019-06-07 DIAGNOSIS — M25611 Stiffness of right shoulder, not elsewhere classified: Secondary | ICD-10-CM

## 2019-06-07 DIAGNOSIS — R29898 Other symptoms and signs involving the musculoskeletal system: Secondary | ICD-10-CM

## 2019-06-07 DIAGNOSIS — R41844 Frontal lobe and executive function deficit: Secondary | ICD-10-CM

## 2019-06-07 DIAGNOSIS — R278 Other lack of coordination: Secondary | ICD-10-CM

## 2019-06-07 DIAGNOSIS — R29818 Other symptoms and signs involving the nervous system: Secondary | ICD-10-CM

## 2019-06-07 DIAGNOSIS — M6281 Muscle weakness (generalized): Secondary | ICD-10-CM

## 2019-06-07 DIAGNOSIS — M25612 Stiffness of left shoulder, not elsewhere classified: Secondary | ICD-10-CM

## 2019-06-07 NOTE — Therapy (Signed)
[facility] [address] [place], [place], [address] Phone: [phone]   Fax:  [phone]  Occupational Therapy Treatment  Patient Details  Name: Brady [name] MRN: [id number] Date of Birth: 1960/12/14 Referring Provider (OT): Dr. Frances FurbishAthar   Encounter Date: 06/07/2019  OT End of Session - 06/07/19 1024    Visit Number  3    Number of Visits  13    Date for OT Re-Evaluation  07/30/19    Authorization Type  BCBS    Authorization - Visit Number  3   12/30 visits used previously   Authorization - Number of Visits  13    OT Start Time  1022    OT Stop Time  1100    OT Time Calculation (min)  38 min    Activity Tolerance  Patient tolerated treatment well    Behavior During Therapy  Restless;Impulsive       Past Medical History:  Diagnosis Date  . Anxiety   . Carotid artery stenosis   . Dyshydrosis   . Hyperlipidemia   . Hypertension     Past Surgical History:  Procedure Laterality Date  . ORIF HUMERUS FRACTURE Right 11/18/2018   Procedure: OPEN REDUCTION INTERNAL FIXATION (ORIF) PROXIMAL HUMERUS FRACTURE;  Surgeon: Francena HanlySupple, Kevin, MD;  Location: [facility];  Service: Orthopedics;  Laterality: Right;    There were no vitals filed for this visit.  Subjective Assessment - 06/07/19 1024    Subjective   Pins were removed on 05/23/19    Pertinent History  Parkinsonism,L scapular fx and right humeral fx in March 2020-no precautions, hammertoe surgery    Patient Stated Goals  maximize independence    Currently in Pain?  No/denies            Treatment: Seated PWR! up and PWR! Rock followed by supine closed chain shoulder flexion, 10 reps each, mod v.c for larger amplitude movements Arm bike x 6 mins level 1 for conditioning, pt maintained 25-30 rpm min v.c Seated dynamic reaching with trunk rotation for left and right UE's, mod v.c for larger amplitude movements                 OT Short Term Goals  - 05/01/19 1313      OT SHORT TERM GOAL #1   Title  I with HEP.    Time  4    Period  Weeks    Status  New      OT SHORT TERM GOAL #2   Title  Pt will demonstrate understanding of adapted strategies to maximize safety and independence with ADLs/IADLs.    Time  4    Period  Weeks    Status  New      OT SHORT TERM GOAL #3   Title  Pt will demonstrate ability to retrieve a lightweight object with RUE at 100 shoulder flexion with -15 elbow extension    Baseline  90, -20    Time  4    Period  Weeks    Status  New      OT SHORT TERM GOAL #4   Title  Pt will demonstrate ability to retrieve a lightweight object with LUE at 125 shoulder flexion and -15 elbow ext.    Baseline  120, -20    Time  4    Period  Weeks    Status  New      OT SHORT TERM GOAL #5   Title  Pt will demonstrate  ability to write a short paragraph with no significant decrease in letter size and 100% legibility.    Time  4    Period  Weeks    Status  New        OT Long Term Goals - 05/01/19 1320      OT LONG TERM GOAL #1   Title  Pt will verbalize understanding of ways to prevent future PD related complication and community resources.    Time  12    Period  Weeks    Status  New    Target Date  07/30/19      OT LONG TERM GOAL #2   Title  Pt will demonstrate improved ease with dressing as evidenced by decreasing PPT#4 to 25 secs or less    Baseline  30.28 secs    Time  12    Period  Weeks    Status  New      OT LONG TERM GOAL #3   Title  Pt will demonstrate increased ease with fastening buttons as evidenced by decreasing 3 button/ unbutton to 75 secs or less    Baseline  84 secs    Time  12    Period  Weeks    Status  New      OT LONG TERM GOAL #4   Title  Pt will demonstrate improved bilateral UE functional use as evidenced by increasing bilateral box/ blocks score by 3 blocks.    Baseline  RUE 43, LUE 44    Time  12    Period  Weeks    Status  New            Plan - 06/07/19 1024     Clinical Impression Statement  Pt is progressing slowly toawards goals.He is limited by cognitve deficits and decreased awareness of bradykinesia    OT Occupational Profile and History  Detailed Assessment- Review of Records and additional review of physical, cognitive, psychosocial history related to current functional performance    Occupational performance deficits (Please refer to evaluation for details):  ADL's;IADL's;Work;Leisure;Social Participation    Body Structure / Function / Physical Skills  ADL;Decreased knowledge of precautions;Flexibility;ROM;UE functional use;Mobility;FMC;Decreased knowledge of use of DME;Balance;Dexterity;Gait;GMC;Strength;Pain;Endurance;Coordination;IADL;Tone    Cognitive Skills  Attention;Memory;Problem Solve;Safety Awareness;Sequencing;Thought    Rehab Potential  Good    Clinical Decision Making  Several treatment options, min-mod task modification necessary    Comorbidities Affecting Occupational Performance:  May have comorbidities impacting occupational performance    Modification or Assistance to Complete Evaluation   Min-Moderate modification of tasks or assist with assess necessary to complete eval   extra time, verbal cues   OT Frequency  1x / week    OT Duration  12 weeks   plus eval, may d/c after 8 weeks   OT Treatment/Interventions  Self-care/ADL training;Therapeutic exercise;Patient/family education;Neuromuscular education;Aquatic Therapy;Moist Heat;Paraffin;Fluidtherapy;Energy conservation;Therapeutic activities;Balance training;Cognitive remediation/compensation;Passive range of motion;Manual Therapy;DME and/or AE instruction;Contrast Bath;Ultrasound;Cryotherapy;Functional Mobility Training    Plan  supine closed chain shoulder flexion, functional activities with big movements    OT Home Exercise Plan  coordination issued    Consulted and Agree with Plan of Care  Patient;Family member/caregiver    Family Member Consulted  wife       Patient will  benefit from skilled therapeutic intervention in order to improve the following deficits and impairments:   Body Structure / Function / Physical Skills: ADL, Decreased knowledge of precautions, Flexibility, ROM, UE functional use, Mobility, FMC, Decreased knowledge of use of DME,  Balance, Dexterity, Gait, GMC, Strength, Pain, Endurance, Coordination, IADL, Tone Cognitive Skills: Attention, Memory, Problem Solve, Safety Awareness, Sequencing, Thought     Visit Diagnosis: Other lack of coordination  Other symptoms and signs involving the musculoskeletal system  Other symptoms and signs involving the nervous system  Stiffness of left shoulder, not elsewhere classified  Stiffness of right shoulder, not elsewhere classified  Frontal lobe and executive function deficit  Muscle weakness (generalized)    Problem List Patient Active Problem List   Diagnosis Date Noted  . Proximal humerus fracture 11/18/2018  . Controlled substance agreement signed 04/27/2017  . Other insomnia 10/23/2016  . Anxiety disorder 10/08/2015  . Carotid artery stenosis, asymptomatic 10/08/2015  . Dyshydrosis 10/08/2015  . Essential hypertension 10/08/2015  . Hyperlipidemia LDL goal <100 10/08/2015  . Obesity (BMI 30-39.9) 10/08/2015  . Screening for prostate cancer 10/08/2015    Claire Dolores 06/07/2019, 10:36 AM  [facility] [address] [place], [place], [address] Phone: [phone]   Fax:  [phone]  Name: Brady [name] MRN: [id number] Date of Birth: 1961-04-16

## 2019-06-07 NOTE — Patient Instructions (Addendum)
Sit to Stand Transfers:  1. Scoot out to the edge of the chair 2. Place your feet flat on the floor, shoulder width (Wide) apart.  Make sure your feet are tucked just under your knees. 3. Lean forward (nose over toes) with momentum, and stand up tall with your best posture.  If you need to use your arms, use them as a quick boost up to stand. 4. If you are in a low or soft chair, you can lean back and then forward up to stand, in order to get more momentum. 5. Once you are standing, make sure you are looking ahead and standing tall. 6. Stand with your feet wide and rock side to side until you are ready to take your first, BIG, deliberate step.  To sit down:  1. Back up until you feel the chair behind your legs. 2. Bend at your hips, reaching  Back for you chair, if needed, then slowly squat to sit down on your chair.  Words in bold indicate brief, targeted, consistent cueing for wife to assist

## 2019-06-08 ENCOUNTER — Ambulatory Visit: Payer: BC Managed Care – PPO | Admitting: Neurology

## 2019-06-08 ENCOUNTER — Telehealth: Payer: Self-pay | Admitting: *Deleted

## 2019-06-08 NOTE — Telephone Encounter (Signed)
Pt called states his toenails and callouses need maintenance and asked if Dr. Paulla Dolly would take care of at next appt 06/20/2019.

## 2019-06-08 NOTE — Therapy (Signed)
[facility] [address] [place], [place], [address] Phone: [phone]   Fax:  [phone]  Physical Therapy Treatment  Patient Details  Name: Brady PilotStephen [name] MRN: [id number] Date of Birth: [birth year] Referring Provider (PT): Dr. Frances FurbishAthar   Encounter Date: 06/07/2019  PT End of Session - 06/08/19 0817    Visit Number  3    Number of Visits  18   per recert 06/02/2019   Date for PT Re-Evaluation  08/31/19    Authorization Type  BCBS: 30 visit limit, 12 used for ortho PT (s/p ORIF), OT and PT count as 1 visit if on same day per chart.    Authorization - Visit Number  15   including previous visits from PT earlier this year-   Authorization - Number of Visits  30    PT Start Time  1107    PT Stop Time  1147    PT Time Calculation (min)  40 min    Equipment Utilized During Treatment  Other (comment)   min guard to S prn   Activity Tolerance  Patient tolerated treatment well   Pt fatigued at end of session   Behavior During Therapy  [facility] for tasks assessed/performed       Past Medical History:  Diagnosis Date  . Anxiety   . Carotid artery stenosis   . Dyshydrosis   . Hyperlipidemia   . Hypertension     Past Surgical History:  Procedure Laterality Date  . ORIF HUMERUS FRACTURE Right 11/18/2018   Procedure: OPEN REDUCTION INTERNAL FIXATION (ORIF) PROXIMAL HUMERUS FRACTURE;  Surgeon: Francena HanlySupple, Kevin, MD;  Location: [facility];  Service: Orthopedics;  Laterality: Right;    There were no vitals filed for this visit.  Subjective Assessment - 06/07/19 1108    Subjective  No changes since last visit.  Haven't called yet about the walker wheels.  Feel like I'll get too reliant on the walker.    Patient is accompained by:  Family member   [name]   Pertinent History  HTN, HLD, anxiety, carotid stenosis, obesity, hx of R humerus ORIF and L scapula fx due to 3/20 fall, hammer toe surgery on 04/19/19 (digits 2-4)    Patient  Stated Goals  Improve strength, improve endurance, improve balance (Zoloft made balance worse)    Currently in Pain?  No/denies                       [facility] - 06/08/19 0805      Transfers   Transfers  Sit to Stand;Stand to Sit    Sit to Stand  5: Supervision;Without upper extremity assist;From chair/3-in-1    Sit to Stand Details  Verbal cues for sequencing;Verbal cues for technique    Sit to Stand Details (indicate cue type and reason)  Cues provided to SCOOT to edge of chair, place feet WIDE apart, forward lean to STAND UP TALL    Stand to Sit  5: Supervision;To chair/3-in-1;With upper extremity assist    Stand to Sit Details (indicate cue type and reason)  Verbal cues for sequencing;Verbal cues for technique   to slow descent into sitting   Number of Reps  2 sets;Other reps (comment)   5 reps   Transfer Cueing  Upon standing, PT provides cues to peform lateral weightshift 5-10 reps prior to initiating gait with BIG step length.    Comments  Discussed with pt/wife at end of session, how using brief,  targeted, consistent verbal cues can help patient with correct sequence for transfers.      Ambulation/Gait   Ambulation/Gait  Yes    Ambulation/Gait Assistance  4: Min guard    Ambulation Distance (Feet)  345 Feet   RW, 115 ft cane   Assistive device  Straight cane;Rolling walker    Gait Pattern  Step-through pattern;Decreased stride length;Decreased dorsiflexion - left;Decreased dorsiflexion - right;Decreased hip/knee flexion - right;Decreased hip/knee flexion - left;Shuffle;Decreased step length - right;Decreased step length - left;Narrow base of support;Poor foot clearance - left;Poor foot clearance - right;Festinating    Ambulation Surface  Level;Indoor    Pre-Gait Activities  Standing at counter:  forward kicks x 10 reps each side to address initial swing phase of gait; forward step and weigthshift x 10 reps each side, with focus on heel strike  and step length, with 1 UE support at counter.    Gait Comments  Overall improved deliberate foot clearance, step length with gait today.  With cane, pt able to perform gait with step-through pattern with correct cane sequence >50% of the time.  Pt does need cues for wider BOS.  With turns, pt demo decreased foot clearance/festination.  Worked on widening BOS and lifting feet for improved foot clearance with turns.  With gait with RW, performed stop and start gait x 3 reps, to reinforce, STOP, lateral weightshift and start again with BIG, deliberate step length.      Additional short distance gait throughout session using RW, cues for correct sequence with initiation of gait.  Reviewed sit to stand and seated ankle pumps (2 sets x 10 reps) as part of HEP from last visit.  Pt return demo understanding.       PT Education - 06/08/19 0816    Education Details  Sit to stand technique, rocking and BIG deliberate steps with gait; use of appropriate verbal cues from wife to assist/reinforce optimal patterns    Person(s) Educated  Patient;Spouse    Methods  Explanation;Demonstration;Verbal cues;Handout    Comprehension  Verbalized understanding;Returned demonstration;Verbal cues required;Need further instruction       PT Short Term Goals - 06/03/19 1009      PT SHORT TERM GOAL #1   Title  Perform FGA or Mini BESTest and write goals as indicated. TARGET DATE FOR ALL STGS: 07/01/19    Time  4    Period  Weeks    Status  On-going      PT SHORT TERM GOAL #2   Title  Pt will be perform HEP with S from wife to improve strength, balance, and gait.    Time  4    Period  Weeks    Status  On-going      PT SHORT TERM GOAL #3   Title  Pt will perform normal TUG with LRAD in </=15 sec. to decr. falls risk.    Time  4    Status  Revised      PT SHORT TERM GOAL #4   Title  Pt will improve gait speed with LRAD to >/=2.48ft/sec. to safely amb. in the community.    Time  4    Period  Weeks     Status  On-going      PT SHORT TERM GOAL #5   Title  Pt will amb. 500' with LRAD at MOD I level, over even terrain to improve functional mobility.    Time  4    Period  Weeks    Status  On-going        PT Long Term Goals - 06/03/19 1010      PT LONG TERM GOAL #1   Title  Pt will improve cognitive TUG time with LRAD to </=15 sec. to decr. falls risk. TARGET DATE FOR ALL LTGS: 07/29/2019    Time  8    Period  Weeks    Status  On-going      PT LONG TERM GOAL #2   Title  Pt will amb. 1000' over paved surfaces with LRAD at MOD I level    Time  8    Period  Weeks    Status  On-going      PT LONG TERM GOAL #3   Title  Pt will be IND in progressed HEP to improve strength, balance, and gait.    Time  8    Period  Weeks    Status  On-going      PT LONG TERM GOAL #4   Title  Pt will report no falls over last four weeks to improve safety.    Time  8    Period  Weeks    Status  On-going      PT LONG TERM GOAL #5   Title  Pt will perform 8 of 10 reps of sit<>stand with proper technique, minimal UE use, no LOB, for improved transfer safety and efficiency.    Time  8    Period  Weeks    Status  New            Plan - 06/08/19 0819    Clinical Impression Statement  Pt demonstrates improved transfer sequence today and improved overall attention to deliberate gait patterns with RW and cane (Pt does come in again today with cane, despite PT discussions previous 2 visits about benefits/recommnedations of RW).  Focused today's session on additional transfer training, initiation of gait, and stops/starts to address pt's festination upon initiation of gait and turns.  Discussed and practiced with wife at end of session brief, targeted verbal cues to help with consistency and carryover with optimal transfer technique.  Discussed adding visits to incr freqency to 2x/wk for additional repetetition/improved carryover with optimal mobility patterns.    Personal Factors and Comorbidities   Comorbidity 3+;Behavior Pattern    Comorbidities  see above    Examination-Activity Limitations  Carry;Locomotion Level;Stand;Lift    Examination-Participation Restrictions  Other   work-currently on short-term disability   Stability/Clinical Decision Making  Evolving/Moderate complexity    Rehab Potential  Good    PT Frequency  2x / week   per recert 06/02/2019; will need to monitor visit limit   PT Duration  8 weeks    PT Treatment/Interventions  ADLs/Self Care Home Management;Electrical Stimulation;DME Instruction;Gait training;Stair training;Functional mobility training;Therapeutic activities;Therapeutic exercise;Balance training;Orthotic Fit/Training;Patient/family education;Cognitive remediation;Neuromuscular re-education;Manual techniques;Vestibular    PT Next Visit Plan  Check about walker wheels; review transfer training/initiation of gait sequence.  Assess Sharlene Motts and write goal; work on foot clearance, step length, turns    Consulted and Agree with Plan of Care  Patient;Family member/caregiver    Family Member Consulted  wife: Myra       Patient will benefit from skilled therapeutic intervention in order to improve the following deficits and impairments:  Abnormal gait, Decreased coordination, Decreased range of motion, Difficulty walking, Impaired tone, Decreased endurance, Decreased knowledge of use of DME, Decreased balance, Decreased mobility, Decreased cognition, Decreased strength, Impaired sensation, Postural dysfunction, Impaired flexibility, Pain(pt reported pain in foot  2/2 surgery during gait, PT will monitor closely but not directly treat)  Visit Diagnosis: Unsteadiness on feet  Other abnormalities of gait and mobility     Problem List Patient Active Problem List   Diagnosis Date Noted  . Proximal humerus fracture 11/18/2018  . Controlled substance agreement signed 04/27/2017  . Other insomnia 10/23/2016  . Anxiety disorder 10/08/2015  . Carotid artery stenosis,  asymptomatic 10/08/2015  . Dyshydrosis 10/08/2015  . Essential hypertension 10/08/2015  . Hyperlipidemia LDL goal <100 10/08/2015  . Obesity (BMI 30-39.9) 10/08/2015  . Screening for prostate cancer 10/08/2015    Frazier Butt. 06/08/2019, 8:24 AM  Frazier Butt., PT   [place] [address] [place], [place], [address] Phone: [phone]   Fax:  [phone]  Name: Brady [name] MRN: [id number] Date of Birth: Mar 20, 1961

## 2019-06-08 NOTE — Telephone Encounter (Signed)
I called Brady [name] and informed that we would put a note on his 06/20/2019 appt for Dr. Paulla Dolly to trim his nails.

## 2019-06-10 ENCOUNTER — Ambulatory Visit (INDEPENDENT_AMBULATORY_CARE_PROVIDER_SITE_OTHER): Payer: BC Managed Care – PPO | Admitting: Podiatry

## 2019-06-10 ENCOUNTER — Encounter: Payer: Self-pay | Admitting: Podiatry

## 2019-06-10 ENCOUNTER — Other Ambulatory Visit: Payer: Self-pay

## 2019-06-10 DIAGNOSIS — B351 Tinea unguium: Secondary | ICD-10-CM

## 2019-06-10 DIAGNOSIS — Z09 Encounter for follow-up examination after completed treatment for conditions other than malignant neoplasm: Secondary | ICD-10-CM

## 2019-06-10 DIAGNOSIS — M79674 Pain in right toe(s): Secondary | ICD-10-CM

## 2019-06-10 DIAGNOSIS — M79675 Pain in left toe(s): Secondary | ICD-10-CM | POA: Diagnosis not present

## 2019-06-12 NOTE — Progress Notes (Signed)
Subjective:   Patient ID: Brady [name], male   DOB: 58 y.o.   MRN: [id number]   HPI Patient states overall doing better from surgery but has nail disease 1-5 both feet that are incurvated and he cannot cut and they become painful   ROS      Objective:  Physical Exam  Neurovascular status unchanged with digits that are in good alignment with keratotic lesions that are resolving with thick yellow brittle nailbeds 1-5 both feet that become painful and he cannot cut     Assessment:  Mycotic nail infection with pain 1-5 both feet     Plan:  Reviewed he is doing well with surgery and today debrided nailbeds 1-5 both feet and reappoint for routine care

## 2019-06-14 ENCOUNTER — Ambulatory Visit: Payer: BC Managed Care – PPO | Admitting: Physical Therapy

## 2019-06-14 ENCOUNTER — Ambulatory Visit: Payer: BC Managed Care – PPO | Admitting: Occupational Therapy

## 2019-06-14 ENCOUNTER — Other Ambulatory Visit: Payer: Self-pay

## 2019-06-14 DIAGNOSIS — M6281 Muscle weakness (generalized): Secondary | ICD-10-CM

## 2019-06-14 DIAGNOSIS — R29818 Other symptoms and signs involving the nervous system: Secondary | ICD-10-CM

## 2019-06-14 DIAGNOSIS — M25612 Stiffness of left shoulder, not elsewhere classified: Secondary | ICD-10-CM

## 2019-06-14 DIAGNOSIS — R278 Other lack of coordination: Secondary | ICD-10-CM

## 2019-06-14 DIAGNOSIS — R2681 Unsteadiness on feet: Secondary | ICD-10-CM

## 2019-06-14 DIAGNOSIS — R29898 Other symptoms and signs involving the musculoskeletal system: Secondary | ICD-10-CM

## 2019-06-14 DIAGNOSIS — M25611 Stiffness of right shoulder, not elsewhere classified: Secondary | ICD-10-CM

## 2019-06-14 DIAGNOSIS — R2689 Other abnormalities of gait and mobility: Secondary | ICD-10-CM

## 2019-06-14 DIAGNOSIS — R41844 Frontal lobe and executive function deficit: Secondary | ICD-10-CM

## 2019-06-14 NOTE — Therapy (Signed)
[facility] [address] [place], [place], [address] Phone: [phone]   Fax:  [phone]  Physical Therapy Treatment  Patient Details  Name: Brady PilotStephen [name] MRN: [id number] Date of Birth: 03/14/1961 Referring Provider (PT): Dr. Frances FurbishAthar   Encounter Date: 06/14/2019  PT End of Session - 06/14/19 1528    Visit Number  4    Number of Visits  18   per recert 06/02/2019   Date for PT Re-Evaluation  08/31/19    Authorization Type  [employer]: 30 visit limit, 12 used for ortho PT (s/p ORIF), OT and PT count as 1 visit if on same day per chart.    Authorization - Visit Number  16   including previous visits from PT earlier this year-   Authorization - Number of Visits  30    PT Start Time  1020    PT Stop Time  1101    PT Time Calculation (min)  41 min    Equipment Utilized During Treatment  Other (comment)   min guard to S prn   Activity Tolerance  Patient tolerated treatment well   Pt fatigued at end of session   Behavior During Therapy  [facility] for tasks assessed/performed       Past Medical History:  Diagnosis Date  . Anxiety   . Carotid artery stenosis   . Dyshydrosis   . Hyperlipidemia   . Hypertension     Past Surgical History:  Procedure Laterality Date  . ORIF HUMERUS FRACTURE Right 11/18/2018   Procedure: OPEN REDUCTION INTERNAL FIXATION (ORIF) PROXIMAL HUMERUS FRACTURE;  Surgeon: Francena HanlySupple, Kevin, MD;  Location: [facility];  Service: Orthopedics;  Laterality: Right;    There were no vitals filed for this visit.  Subjective Assessment - 06/14/19 1024    Subjective  Just feel off balance-maybe will try to call the doctor about that.  When reminded about wheels of RW, pt reports that he isn't going to do that.  Had a fall in the shower last week, bruised R hip.    Patient is accompained by:  Family member   [name]   Pertinent History  HTN, HLD, anxiety, carotid stenosis, obesity, hx of R humerus ORIF and L scapula  fx due to 3/20 fall, hammer toe surgery on 04/19/19 (digits 2-4)    Patient Stated Goals  Improve strength, improve endurance, improve balance (Zoloft made balance worse)    Currently in Pain?  Yes    Pain Score  0-No pain   reported bruising from fall, no pain                      [facility] Adult PT Treatment/Exercise - 06/14/19 0001      Transfers   Transfers  Sit to Stand;Stand to Sit    Sit to Stand  5: Supervision;Without upper extremity assist;From chair/3-in-1;From bed    Sit to Stand Details  Verbal cues for sequencing;Verbal cues for technique    Sit to Stand Details (indicate cue type and reason)  Cues for correct technique    Stand to Sit  5: Supervision;To chair/3-in-1;With upper extremity assist    Number of Reps  2 sets;Other reps (comment)   5 reps from mat   Transfer Cueing  Pt requires consistent cues for transfer sequence; when cues removed, pt does not perform sequence correctly.      Ambulation/Gait   Ambulation/Gait  Yes    Ambulation/Gait Assistance  4: Min guard  Ambulation/Gait Assistance Details  Pt brings in cane to session, and reports difficulty with turns    Assistive device  Straight cane    Gait Pattern  Step-through pattern;Decreased stride length;Decreased dorsiflexion - left;Decreased dorsiflexion - right;Decreased hip/knee flexion - right;Decreased hip/knee flexion - left;Shuffle;Decreased step length - right;Decreased step length - left;Narrow base of support;Poor foot clearance - left;Poor foot clearance - right;Festinating;Decreased arm swing - right;Decreased arm swing - left;Decreased weight shift to right;Decreased weight shift to left    Ambulation Surface  Level;Indoor    Pre-Gait Activities  Turning practice at chair for UE support:  see below for details    Gait Comments  Sit<>stand transfers wtih short distance and turns; cues for turning to increase foot clearance.  Repeated x 4 time, with pt tending to do better with step  together (quarter turns) than weightshifting turns.      Standardized Balance Assessment   Standardized Balance Assessment  Berg Balance Test      Berg Balance Test   Sit to Stand  Able to stand without using hands and stabilize independently    Standing Unsupported  Able to stand safely 2 minutes    Sitting with Back Unsupported but Feet Supported on Floor or Stool  Able to sit safely and securely 2 minutes    Stand to Sit  Controls descent by using hands    Transfers  Able to transfer safely, definite need of hands    Standing Unsupported with Eyes Closed  Able to stand 10 seconds with supervision    Standing Ubsupported with Feet Together  Able to place feet together independently and stand for 1 minute with supervision    From Standing, Reach Forward with Outstretched Arm  Loses balance while trying/requires external support    From Standing Position, Pick up Object from Floor  Able to pick up shoe, needs supervision    From Standing Position, Turn to Look Behind Over each Shoulder  Looks behind one side only/other side shows less weight shift    Turn 360 Degrees  Needs assistance while turning   > 12 s teps, 18.84, to RLE 19.38   Standing Unsupported, Alternately Place Feet on Step/Stool  Able to complete 4 steps without aid or supervision    Standing Unsupported, One Foot in Front  Able to take small step independently and hold 30 seconds    Standing on One Leg  Tries to lift leg/unable to hold 3 seconds but remains standing independently    Total Score  35      High Level Balance   High Level Balance Comments  Lateral weigthshifting 10 reps, with progression to shift and lift x 10 reps; then tried shifting/lifting for 360 turns R and L x 1 rep each.  Side step and weightshift x 10 reps each leg with UE supported at chair, progress to side step together (60-90 degree turn)             PT Education - 06/14/19 1527    Education Details  LAteral weightshifting, turning technique     Person(s) Educated  Patient    Methods  Explanation;Demonstration;Handout    Comprehension  Verbalized understanding;Returned demonstration;Verbal cues required       PT Short Term Goals - 06/14/19 1531      PT SHORT TERM GOAL #1   Title  Perform FGA or Mini BESTest and write goals as indicated. TARGET DATE FOR ALL STGS: 07/01/19 (Improve Berg score by at least 5 points for decr.  fall risk)    Baseline  Berg 06/14/2019:  35/56    Time  4    Period  Weeks    Status  On-going      PT SHORT TERM GOAL #2   Title  Pt will be perform HEP with S from wife to improve strength, balance, and gait.    Time  4    Period  Weeks    Status  On-going      PT SHORT TERM GOAL #3   Title  Pt will perform normal TUG with LRAD in </=15 sec. to decr. falls risk.    Time  4    Status  Revised      PT SHORT TERM GOAL #4   Title  Pt will improve gait speed with LRAD to >/=2.11ft/sec. to safely amb. in the community.    Time  4    Period  Weeks    Status  On-going      PT SHORT TERM GOAL #5   Title  Pt will amb. 500' with LRAD at MOD I level, over even terrain to improve functional mobility.    Time  4    Period  Weeks    Status  On-going        PT Long Term Goals - 06/03/19 1010      PT LONG TERM GOAL #1   Title  Pt will improve cognitive TUG time with LRAD to </=15 sec. to decr. falls risk. TARGET DATE FOR ALL LTGS: 07/29/2019    Time  8    Period  Weeks    Status  On-going      PT LONG TERM GOAL #2   Title  Pt will amb. 1000' over paved surfaces with LRAD at MOD I level    Time  8    Period  Weeks    Status  On-going      PT LONG TERM GOAL #3   Title  Pt will be IND in progressed HEP to improve strength, balance, and gait.    Time  8    Period  Weeks    Status  On-going      PT LONG TERM GOAL #4   Title  Pt will report no falls over last four weeks to improve safety.    Time  8    Period  Weeks    Status  On-going      PT LONG TERM GOAL #5   Title  Pt will perform 8  of 10 reps of sit<>stand with proper technique, minimal UE use, no LOB, for improved transfer safety and efficiency.    Time  8    Period  Weeks    Status  New            Plan - 06/14/19 1529    Clinical Impression Statement  Assessed Berg Balance score today, with pt scoring 35/56 (indicating high fall risk and appropriate assistive device is walker).  Discussed this with patient, as he came in with cane again and reprots not planning to use walker (has not gotten wheels for it).  Focused remainder of session on sit<>stand, short distance gait and turning practice, with pt needing near constant cues for correct technique.  Pt tends to turn best with step together/quarter turn method.    Personal Factors and Comorbidities  Comorbidity 3+;Behavior Pattern    Comorbidities  see above    Examination-Activity Limitations  Carry;Locomotion Level;Stand;Lift    Examination-Participation Restrictions  Other   work-currently on short-term disability   Stability/Clinical Decision Making  Evolving/Moderate complexity    Rehab Potential  Good    PT Frequency  2x / week   per recert 93/05/299; will need to monitor visit limit   PT Duration  8 weeks    PT Treatment/Interventions  ADLs/Self Care Home Management;Electrical Stimulation;DME Instruction;Gait training;Stair training;Functional mobility training;Therapeutic activities;Therapeutic exercise;Balance training;Orthotic Fit/Training;Patient/family education;Cognitive remediation;Neuromuscular re-education;Manual techniques;Vestibular    PT Next Visit Plan  Review transfer training/initiation of gait sequence and turns. Work on foot clearance, step length, turns with RW    Consulted and Agree with Plan of Care  Patient       Patient will benefit from skilled therapeutic intervention in order to improve the following deficits and impairments:  Abnormal gait, Decreased coordination, Decreased range of motion, Difficulty walking, Impaired tone,  Decreased endurance, Decreased knowledge of use of DME, Decreased balance, Decreased mobility, Decreased cognition, Decreased strength, Impaired sensation, Postural dysfunction, Impaired flexibility, Pain(pt reported pain in foot 2/2 surgery during gait, PT will monitor closely but not directly treat)  Visit Diagnosis: Unsteadiness on feet  Other abnormalities of gait and mobility     Problem List Patient Active Problem List   Diagnosis Date Noted  . Proximal humerus fracture 11/18/2018  . Controlled substance agreement signed 04/27/2017  . Other insomnia 10/23/2016  . Anxiety disorder 10/08/2015  . Carotid artery stenosis, asymptomatic 10/08/2015  . Dyshydrosis 10/08/2015  . Essential hypertension 10/08/2015  . Hyperlipidemia LDL goal <100 10/08/2015  . Obesity (BMI 30-39.9) 10/08/2015  . Screening for prostate cancer 10/08/2015    Jadarius Commons W. 06/14/2019, 3:34 PM Frazier Butt., PT  [facility] [address] [place], [place], [address] Phone: [phone]   Fax:  [phone]  Name: Bell Cai MRN: [id number] Date of Birth: 1960/10/15

## 2019-06-14 NOTE — Therapy (Signed)
[facility] [address] [place], [place], [address] Phone: [phone]   Fax:  [phone]  Occupational Therapy Treatment  Patient Details  Name: Brady [name] MRN: [id number] Date of Birth: 1960/12/31 Referring Provider (OT): Dr. Frances Furbish   Encounter Date: 06/14/2019  OT End of Session - 06/14/19 1208    Visit Number  4    Number of Visits  13    Date for OT Re-Evaluation  07/30/19    Authorization Type  BCBS    Authorization - Visit Number  4   12/30 visits used previously   Authorization - Number of Visits  13    OT Start Time  1105    OT Stop Time  1145    OT Time Calculation (min)  40 min    Activity Tolerance  Patient tolerated treatment well    Behavior During Therapy  Restless;Impulsive       Past Medical History:  Diagnosis Date  . Anxiety   . Carotid artery stenosis   . Dyshydrosis   . Hyperlipidemia   . Hypertension     Past Surgical History:  Procedure Laterality Date  . ORIF HUMERUS FRACTURE Right 11/18/2018   Procedure: OPEN REDUCTION INTERNAL FIXATION (ORIF) PROXIMAL HUMERUS FRACTURE;  Surgeon: Francena Hanly, MD;  Location: [facility];  Service: Orthopedics;  Laterality: Right;    There were no vitals filed for this visit.  Subjective Assessment - 06/14/19 1214    Subjective   Pt denies pain    Pertinent History  Parkinsonism,L scapular fx and right humeral fx in March 2020-no precautions, hammertoe surgery    Patient Stated Goals  maximize independence    Currently in Pain?  No/denies              Treatment: Arm bike x 5 mins level 1 for conditioning pt maintained 19-28 rpm, mod v.c for speed Reviewed flipping and dealing cards with larger amplitude movements mod v.c              OT Education - 06/14/19 1137    Education Details  PWR! basic 4 in supine 10-20 reps each, min-mod v.c for larger amplitude movments    Person(s) Educated  Patient;Spouse    Methods   Explanation;Demonstration;Verbal cues;Handout    Comprehension  Verbalized understanding;Returned demonstration;Verbal cues required;Tactile cues required       OT Short Term Goals - 06/14/19 1211      OT SHORT TERM GOAL #1   Title  I with HEP.    Time  4    Period  Weeks    Status  On-going      OT SHORT TERM GOAL #2   Title  Pt will demonstrate understanding of adapted strategies to maximize safety and independence with ADLs/IADLs.    Time  4    Period  Weeks    Status  On-going      OT SHORT TERM GOAL #3   Title  Pt will demonstrate ability to retrieve a lightweight object with RUE at 100 shoulder flexion with -15 elbow extension    Baseline  90, -20    Time  4    Period  Weeks    Status  On-going      OT SHORT TERM GOAL #4   Title  Pt will demonstrate ability to retrieve a lightweight object with LUE at 125 shoulder flexion and -15 elbow ext.    Baseline  120, -20    Time  4  Period  Weeks    Status  On-going      OT SHORT TERM GOAL #5   Title  Pt will demonstrate ability to write a short paragraph with no significant decrease in letter size and 100% legibility.    Time  4    Period  Weeks    Status  On-going        OT Long Term Goals - 05/01/19 1320      OT LONG TERM GOAL #1   Title  Pt will verbalize understanding of ways to prevent future PD related complication and community resources.    Time  12    Period  Weeks    Status  New    Target Date  07/30/19      OT LONG TERM GOAL #2   Title  Pt will demonstrate improved ease with dressing as evidenced by decreasing PPT#4 to 25 secs or less    Baseline  30.28 secs    Time  12    Period  Weeks    Status  New      OT LONG TERM GOAL #3   Title  Pt will demonstrate increased ease with fastening buttons as evidenced by decreasing 3 button/ unbutton to 75 secs or less    Baseline  84 secs    Time  12    Period  Weeks    Status  New      OT LONG TERM GOAL #4   Title  Pt will demonstrate improved  bilateral UE functional use as evidenced by increasing bilateral box/ blocks score by 3 blocks.    Baseline  RUE 43, LUE 44    Time  12    Period  Weeks    Status  New            Plan - 06/14/19 1209    Clinical Impression Statement  Pt is progressing towards goals  He demonstrates improving performance of supine PWR! exercises.    OT Occupational Profile and History  Detailed Assessment- Review of Records and additional review of physical, cognitive, psychosocial history related to current functional performance    Occupational performance deficits (Please refer to evaluation for details):  ADL's;IADL's;Work;Leisure;Social Participation    Body Structure / Function / Physical Skills  ADL;Decreased knowledge of precautions;Flexibility;ROM;UE functional use;Mobility;FMC;Decreased knowledge of use of DME;Balance;Dexterity;Gait;GMC;Strength;Pain;Endurance;Coordination;IADL;Tone    Cognitive Skills  Attention;Memory;Problem Solve;Safety Awareness;Sequencing;Thought    Rehab Potential  Good    Clinical Decision Making  Several treatment options, min-mod task modification necessary    Comorbidities Affecting Occupational Performance:  May have comorbidities impacting occupational performance    Modification or Assistance to Complete Evaluation   Min-Moderate modification of tasks or assist with assess necessary to complete eval   extra time, verbal cues   OT Frequency  1x / week    OT Duration  12 weeks   plus eval, may d/c after 8 weeks   OT Treatment/Interventions  Self-care/ADL training;Therapeutic exercise;Patient/family education;Neuromuscular education;Aquatic Therapy;Moist Heat;Paraffin;Fluidtherapy;Energy conservation;Therapeutic activities;Balance training;Cognitive remediation/compensation;Passive range of motion;Manual Therapy;DME and/or AE instruction;Contrast Bath;Ultrasound;Cryotherapy;Functional Mobility Training    Plan  supine closed chain shoulder flexion, functional  activities with big movements    OT Home Exercise Plan  coordination issued    Consulted and Agree with Plan of Care  Patient;Family member/caregiver    Family Member Consulted  wife       Patient will benefit from skilled therapeutic intervention in order to improve the following deficits and impairments:   Body  Structure / Function / Physical Skills: ADL, Decreased knowledge of precautions, Flexibility, ROM, UE functional use, Mobility, FMC, Decreased knowledge of use of DME, Balance, Dexterity, Gait, GMC, Strength, Pain, Endurance, Coordination, IADL, Tone Cognitive Skills: Attention, Memory, Problem Solve, Safety Awareness, Sequencing, Thought     Visit Diagnosis: Other lack of coordination  Other symptoms and signs involving the musculoskeletal system  Other symptoms and signs involving the nervous system  Stiffness of left shoulder, not elsewhere classified  Stiffness of right shoulder, not elsewhere classified  Frontal lobe and executive function deficit  Muscle weakness (generalized)    Problem List Patient Active Problem List   Diagnosis Date Noted  . Proximal humerus fracture 11/18/2018  . Controlled substance agreement signed 04/27/2017  . Other insomnia 10/23/2016  . Anxiety disorder 10/08/2015  . Carotid artery stenosis, asymptomatic 10/08/2015  . Dyshydrosis 10/08/2015  . Essential hypertension 10/08/2015  . Hyperlipidemia LDL goal <100 10/08/2015  . Obesity (BMI 30-39.9) 10/08/2015  . Screening for prostate cancer 10/08/2015    RINE,KATHRYN 06/14/2019, 12:15 PM  [facility] [address] [place], [place], [address] Phone: [phone]   Fax:  [phone]  Name: Aileen PilotStephen [name] MRN: [id number] Date of Birth: February 28, 1961

## 2019-06-14 NOTE — Patient Instructions (Addendum)
Weight Shift: Lateral (Limits of Stability)    Stand with feet at least shoulder width apart.  Slowly shift weight to right as far as possible, without taking a step. Return to starting position. Shift to opposite side.  Repeat __5-10__ times per session. Do __1-2__ sessions per day.  Copyright  VHI. All rights reserved.  Turning in Place: Solid Surface    Standing in place, to turn to the right, lead with the right foot and sidestep around (like a clock) until you are the direction you need to face.  Repeat to the other side. Repeat __3__ times per session. Do __1-2__ sessions per day.  Copyright  VHI. All rights reserved.

## 2019-06-16 ENCOUNTER — Telehealth: Payer: Self-pay | Admitting: Neurology

## 2019-06-16 NOTE — Telephone Encounter (Signed)
I called pt again. He wanted me to repeat Dr. Guadelupe Sabin recommendations. He denies feeling dizzy, lightheaded, or nauseous and is firm that he is "imbalanced". He will continue with the sinemet as prescribed and call us for further questions or concerns.

## 2019-06-16 NOTE — Telephone Encounter (Signed)
I called pt. He reports that his tremor is better on sinemet. However he notices that he feels "imbalanced" on sinemet and is concerned about the fall risk. Pt denies any nausea. Pt wanted to give Dr. Rexene Alberts this update and to ask for her thoughts.

## 2019-06-16 NOTE — Telephone Encounter (Signed)
Unfortunately, the imbalance is likely from the underlying condition, unlikely exacerbated by the medication.  I would favor that he continue with the Sinemet at the current dose for now if he is agreeable.  Please update patient. Please remind patient to stay well-hydrated with water, 6 to 8 cups a day are recommended and change positions slowly, use his walker at all times.

## 2019-06-16 NOTE — Telephone Encounter (Signed)
Pt has called RN Kristen back, please call °

## 2019-06-16 NOTE — Telephone Encounter (Signed)
I called pt and discussed this with him. Pt will continue with the sinemet and follow Dr. Guadelupe Sabin recommendations. Pt verbalized understanding of recommendations. Pt had no questions at this time but was encouraged to call back if questions arise.

## 2019-06-16 NOTE — Telephone Encounter (Signed)
Pt states the 1 medication prescribed on 09-17 has caused him to have dizziness and a feeling of being unbalanced.  Please call

## 2019-06-20 ENCOUNTER — Ambulatory Visit (INDEPENDENT_AMBULATORY_CARE_PROVIDER_SITE_OTHER): Payer: BC Managed Care – PPO

## 2019-06-20 ENCOUNTER — Ambulatory Visit (INDEPENDENT_AMBULATORY_CARE_PROVIDER_SITE_OTHER): Payer: Self-pay | Admitting: Podiatry

## 2019-06-20 ENCOUNTER — Encounter: Payer: Self-pay | Admitting: Podiatry

## 2019-06-20 ENCOUNTER — Other Ambulatory Visit: Payer: Self-pay

## 2019-06-20 DIAGNOSIS — M2042 Other hammer toe(s) (acquired), left foot: Secondary | ICD-10-CM

## 2019-06-20 DIAGNOSIS — M2041 Other hammer toe(s) (acquired), right foot: Secondary | ICD-10-CM

## 2019-06-20 NOTE — Progress Notes (Signed)
Subjective:   Patient ID: Brady [name], male   DOB: 58 y.o.   MRN: [id number]   HPI Patient states he still gets some swelling in his toes but states overall he seems to be doing pretty well in the lesions have gone away   ROS      Objective:  Physical Exam  Neurovascular status intact negative Bevelyn Buckles' sign noted with patient showing excellent fusion of digits bilateral with wound edges that coapted well with mild swelling still noted in the toes secondary to surgery     Assessment:  Continue to have mild swelling in the toes which I consider normal at this.  Postop with good alignment of the digits with wound edges that coapted and removal of the lesion distally which do not appear to be recurrent     Plan:  Reviewed x-rays and discussed that the digits have fused beautifully and I am hoping you will not have to do any type of distal work but we will not know for period of time.  Overall very pleased with the position I explained that is normal to still have some swelling and it should resolve but can take upwards of 6 months for this to happen  X-rays indicate satisfactory fusion of digits 2 through 4 of both the

## 2019-06-21 ENCOUNTER — Ambulatory Visit: Payer: BC Managed Care – PPO | Admitting: Occupational Therapy

## 2019-06-21 ENCOUNTER — Ambulatory Visit: Payer: BC Managed Care – PPO | Admitting: Physical Therapy

## 2019-06-21 ENCOUNTER — Encounter: Payer: Self-pay | Admitting: Occupational Therapy

## 2019-06-21 DIAGNOSIS — M25612 Stiffness of left shoulder, not elsewhere classified: Secondary | ICD-10-CM

## 2019-06-21 DIAGNOSIS — R29818 Other symptoms and signs involving the nervous system: Secondary | ICD-10-CM

## 2019-06-21 DIAGNOSIS — R29898 Other symptoms and signs involving the musculoskeletal system: Secondary | ICD-10-CM

## 2019-06-21 DIAGNOSIS — R278 Other lack of coordination: Secondary | ICD-10-CM

## 2019-06-21 DIAGNOSIS — R2689 Other abnormalities of gait and mobility: Secondary | ICD-10-CM | POA: Diagnosis not present

## 2019-06-21 DIAGNOSIS — M6281 Muscle weakness (generalized): Secondary | ICD-10-CM

## 2019-06-21 DIAGNOSIS — M25611 Stiffness of right shoulder, not elsewhere classified: Secondary | ICD-10-CM

## 2019-06-21 DIAGNOSIS — R41844 Frontal lobe and executive function deficit: Secondary | ICD-10-CM

## 2019-06-21 DIAGNOSIS — R2681 Unsteadiness on feet: Secondary | ICD-10-CM

## 2019-06-21 NOTE — Patient Instructions (Signed)
   Lie on back holding wand. Raise arms over head. Hold 5sec. Repeat 10 times per set.  Do 2-3 sessions per day. Keep elbows straight.         Cane Overhead - Sitting   Sitting With arms straight, hold cane forward at waist. Raise cane above head. Hold 3 seconds. Repeat 10 times. Do 2 times per day.  Copyright  VHI. All rights reserved.

## 2019-06-21 NOTE — Therapy (Signed)
[facility] [address] [place], [place], [address] Phone: [phone]   Fax:  [phone]  Occupational Therapy Treatment  Patient Details  Name: Brady [name] MRN: [id number] Date of Birth: Sep 04, 1960 Referring Provider (OT): Dr. Frances Furbish   Encounter Date: 06/21/2019  OT End of Session - 06/21/19 1023    Visit Number  5    Number of Visits  13    Date for OT Re-Evaluation  07/30/19    Authorization Type  BCBS    Authorization - Visit Number  5   12/30 visits used previously   Authorization - Number of Visits  13    OT Start Time  1018    OT Stop Time  1056    OT Time Calculation (min)  38 min    Activity Tolerance  Patient tolerated treatment well    Behavior During Therapy  Restless;Impulsive       Past Medical History:  Diagnosis Date  . Anxiety   . Carotid artery stenosis   . Dyshydrosis   . Hyperlipidemia   . Hypertension     Past Surgical History:  Procedure Laterality Date  . ORIF HUMERUS FRACTURE Right 11/18/2018   Procedure: OPEN REDUCTION INTERNAL FIXATION (ORIF) PROXIMAL HUMERUS FRACTURE;  Surgeon: Francena Hanly, MD;  Location: [facility];  Service: Orthopedics;  Laterality: Right;    There were no vitals filed for this visit.  Subjective Assessment - 06/21/19 1022    Subjective   discussed with pt recommendation that he uses a walker for decreased fall risk    Pertinent History  Parkinsonism,L scapular fx and right humeral fx in March 2020-no precautions, hammertoe surgery    Patient Stated Goals  maximize independence    Currently in Pain?  No/denies                Treatment: Therapist checked progress towards goals and discussed with pt/ wife. Pt was able to write a paragraph with good legibility and letter size.            OT Education - 06/21/19 1032    Education Details  PWR! basic 4 in supine 10-20 reps each, min-mod v.c for larger amplitude movments, cane  exercises in supine and seated 10-20 reps each, reviewed recommendation that pt uses walker instead of cane and importance of carryover at home in order to progress.    Person(s) Educated  Patient;Spouse    Methods  Explanation;Demonstration;Verbal cues;Handout    Comprehension  Verbalized understanding;Returned demonstration;Verbal cues required;Tactile cues required       OT Short Term Goals - 06/21/19 1033      OT SHORT TERM GOAL #1   Title  I with HEP.    Time  4    Period  Weeks    Status  On-going   needs reinforcement, not performing     OT SHORT TERM GOAL #2   Title  Pt will demonstrate understanding of adapted strategies to maximize safety and independence with ADLs/IADLs.    Time  4    Period  Weeks    Status  On-going   needs reinforcement     OT SHORT TERM GOAL #3   Title  Pt will demonstrate ability to retrieve a lightweight object with RUE at 100 shoulder flexion with -15 elbow extension    Baseline  90, -20    Time  4    Period  Weeks    Status  Achieved   100, -15  OT SHORT TERM GOAL #4   Title  Pt will demonstrate ability to retrieve a lightweight object with LUE at 125 shoulder flexion and -15 elbow ext.    Baseline  120, -20    Time  4    Period  Weeks    Status  Achieved   125, -10     OT SHORT TERM GOAL #5   Title  Pt will demonstrate ability to write a short paragraph with no significant decrease in letter size and 100% legibility.    Time  4    Period  Weeks    Status  Achieved        OT Long Term Goals - 06/21/19 1543      OT LONG TERM GOAL #1   Title  Pt will verbalize understanding of ways to prevent future PD related complication and community resources.    Time  8    Period  Weeks    Status  On-going    Target Date  08/20/19      OT LONG TERM GOAL #2   Title  Pt will demonstrate improved ease with dressing as evidenced by decreasing PPT#4 to 25 secs or less    Baseline  30.28 secs    Time  8    Period  Weeks    Status   On-going      OT LONG TERM GOAL #3   Title  Pt will demonstrate increased ease with fastening buttons as evidenced by decreasing 3 button/ unbutton to 75 secs or less    Baseline  84 secs    Time  8    Period  Weeks    Status  On-going      OT LONG TERM GOAL #4   Title  Pt will demonstrate improved bilateral UE functional use as evidenced by increasing bilateral box/ blocks score by 3 blocks.    Baseline  RUE 43, LUE 44    Time  8    Period  Weeks    Status  On-going            Plan - 06/21/19 1036    Clinical Impression Statement  Pt is progressing slowly towards goals. He demonstrates limited carryover at home, therapist reinforced importance of HEP and walker use.    OT Occupational Profile and History  Detailed Assessment- Review of Records and additional review of physical, cognitive, psychosocial history related to current functional performance    Occupational performance deficits (Please refer to evaluation for details):  ADL's;IADL's;Work;Leisure;Social Participation    Body Structure / Function / Physical Skills  ADL;Decreased knowledge of precautions;Flexibility;ROM;UE functional use;Mobility;FMC;Decreased knowledge of use of DME;Balance;Dexterity;Gait;GMC;Strength;Pain;Endurance;Coordination;IADL;Tone    Cognitive Skills  Attention;Memory;Problem Solve;Safety Awareness;Sequencing;Thought    Rehab Potential  Good    Clinical Decision Making  Several treatment options, min-mod task modification necessary    Comorbidities Affecting Occupational Performance:  May have comorbidities impacting occupational performance    Modification or Assistance to Complete Evaluation   Min-Moderate modification of tasks or assist with assess necessary to complete eval   extra time, verbal cues   OT Frequency  2x / week    OT Duration  8 weeks   updated frequency   OT Treatment/Interventions  Self-care/ADL training;Therapeutic exercise;Patient/family education;Neuromuscular  education;Aquatic Therapy;Moist Heat;Paraffin;Fluidtherapy;Energy conservation;Therapeutic activities;Balance training;Cognitive remediation/compensation;Passive range of motion;Manual Therapy;DME and/or AE instruction;Contrast Bath;Ultrasound;Cryotherapy;Functional Mobility Training    Plan  ADL strategies, supine PWR!,  bag exercises    OT Home Exercise Plan  coordination issued  Consulted and Agree with Plan of Care  Patient;Family member/caregiver    Family Member Consulted  wife       Patient will benefit from skilled therapeutic intervention in order to improve the following deficits and impairments:   Body Structure / Function / Physical Skills: ADL, Decreased knowledge of precautions, Flexibility, ROM, UE functional use, Mobility, FMC, Decreased knowledge of use of DME, Balance, Dexterity, Gait, GMC, Strength, Pain, Endurance, Coordination, IADL, Tone Cognitive Skills: Attention, Memory, Problem Solve, Safety Awareness, Sequencing, Thought     Visit Diagnosis: Other symptoms and signs involving the musculoskeletal system  Other symptoms and signs involving the nervous system  Other lack of coordination  Stiffness of left shoulder, not elsewhere classified  Stiffness of right shoulder, not elsewhere classified  Frontal lobe and executive function deficit  Muscle weakness (generalized)    Problem List Patient Active Problem List   Diagnosis Date Noted  . Proximal humerus fracture 11/18/2018  . Controlled substance agreement signed 04/27/2017  . Other insomnia 10/23/2016  . Anxiety disorder 10/08/2015  . Carotid artery stenosis, asymptomatic 10/08/2015  . Dyshydrosis 10/08/2015  . Essential hypertension 10/08/2015  . Hyperlipidemia LDL goal <100 10/08/2015  . Obesity (BMI 30-39.9) 10/08/2015  . Screening for prostate cancer 10/08/2015    Keonta Monceaux 06/21/2019, 3:50 PM Theone Murdoch, OTR/L [phone] Phone: [phone] 3:50 PM 06/21/19 [place] [address] [place], [place], [address] Phone: [phone]   Fax:  [phone]  Name: Brady [name] MRN: [id number] Date of Birth: December 12, 1960

## 2019-06-22 ENCOUNTER — Encounter: Payer: Self-pay | Admitting: Physical Therapy

## 2019-06-22 NOTE — Therapy (Signed)
[facility] [address] [place], [place], [address] Phone: [phone]   Fax:  [phone]  Physical Therapy Treatment  Patient Details  Name: Brady [name] MRN: [id number] Date of Birth: 03-04-1961 Referring Provider (PT): Dr. Rexene Alberts   Encounter Date: 06/21/2019  PT End of Session - 06/22/19 1355    Visit Number  5    Number of Visits  18   per recert 32/01/7123   Date for PT Re-Evaluation  08/31/19    Authorization Type  BCBS: 30 visit limit, 12 used for ortho PT (s/p ORIF), OT and PT count as 1 visit if on same day per chart.    Authorization - Visit Number  17   including previous visits from PT earlier this year-   Authorization - Number of Visits  30    PT Start Time  1107    PT Stop Time  1147    PT Time Calculation (min)  40 min    Equipment Utilized During Treatment  Other (comment)   min guard to S prn   Activity Tolerance  Patient tolerated treatment well   Pt fatigued at end of session   Behavior During Therapy  [facility] for tasks assessed/performed   Pt laughs with wife while performing PWR! Moves exercises      Past Medical History:  Diagnosis Date  . Anxiety   . Carotid artery stenosis   . Dyshydrosis   . Hyperlipidemia   . Hypertension     Past Surgical History:  Procedure Laterality Date  . ORIF HUMERUS FRACTURE Right 11/18/2018   Procedure: OPEN REDUCTION INTERNAL FIXATION (ORIF) PROXIMAL HUMERUS FRACTURE;  Surgeon: Justice Britain, MD;  Location: [facility];  Service: Orthopedics;  Laterality: Right;    There were no vitals filed for this visit.  Subjective Assessment - 06/22/19 1341    Subjective  Had follow up with MD post foot surgery, still having some toe swelling.  Have not gotten wheels for walker; not using walker at home.    Patient is accompained by:  Family member   [name]   Pertinent History  HTN, HLD, anxiety, carotid stenosis, obesity, hx of R humerus ORIF and L scapula fx  due to 3/20 fall, hammer toe surgery on 04/19/19 (digits 2-4)    Patient Stated Goals  Improve strength, improve endurance, improve balance (Zoloft made balance worse)    Currently in Pain?  No/denies                       [facility] Adult PT Treatment/Exercise - 06/22/19 0001      Transfers   Transfers  Sit to Stand;Stand to Sit    Sit to Stand  5: Supervision;Without upper extremity assist;From chair/3-in-1;From bed    Sit to Stand Details  Verbal cues for sequencing;Verbal cues for technique    Stand to Sit  5: Supervision;To chair/3-in-1;With upper extremity assist    Number of Reps  2 sets;Other reps (comment)   5 reps at least, throughout session   Comments  Reviewed sit<>stand technique from previous sessions; pt with occasional uncontrolled descent, and continues to need cues for widened BOS upon standing.      Ambulation/Gait   Ambulation/Gait  Yes    Ambulation/Gait Assistance  4: Min guard    Ambulation/Gait Assistance Details  Pt brings in cane to session today; given that pt is not using walker at home and has not gotten wheels for walker, PT practiced gait with  cane with patient today.    Ambulation Distance (Feet)  230 Feet   x 3 reps   Assistive device  Straight cane    Gait Pattern  Step-through pattern;Decreased stride length;Decreased dorsiflexion - left;Decreased dorsiflexion - right;Decreased hip/knee flexion - right;Decreased hip/knee flexion - left;Shuffle;Decreased step length - right;Decreased step length - left;Narrow base of support;Poor foot clearance - left;Poor foot clearance - right;Festinating;Decreased arm swing - right;Decreased arm swing - left;Decreased weight shift to right;Decreased weight shift to left    Ambulation Surface  Level;Indoor    Pre-Gait Activities  Reviewed turning practice from last visit, sidestep turns to R and L, with UE support needed.  When using cane, needs cues to move cane out of the way during turn.    Gait Comments   Attempted gait x 115 ft with bilateral walking poles, to facilitate increased arm swing, with PT providing cues for increased step length and PT provides assistance for reciprocal arm swing; patient has significant rigidity and stiffness through bilateral UEs, which limits assisted or independent arm swing.  When bilateral walking poles removed, and pt uses cane, he is able to briefly have RUE arm swing with gait.  Anytime conversation begins or pt has distractions, he reverts back to shuffling pattern of gait.        PWR [facility] - 06/21/19 1123    PWR! exercises  Moves in standing    PWR! Up  x 10 reps   PT provides tactile cues each rep for posture, UE position    PWR! Rock  x 10 reps   PT provides cues for technique, positioning, optimal shift   PWR! Twist  x 5 reps reaching across body; x 5 reps with open/clapping   PT provides visual, verbal cues   PWR Step  x 10 reps side, back, forward   UE support at chair, cues for incr. step length, weightshift   Comments  Cues provided throughout above exercise-visual, verbal, tactile cues.       REview of lateral weightshifting upon standing, cues for widened BOS, 2 sets x 5 reps.       PT Short Term Goals - 06/14/19 1531      PT SHORT TERM GOAL #1   Title  Perform FGA or Mini BESTest and write goals as indicated. TARGET DATE FOR ALL STGS: 07/01/19 (Improve Berg score by at least 5 points for decr. fall risk)    Baseline  Berg 06/14/2019:  35/56    Time  4    Period  Weeks    Status  On-going      PT SHORT TERM GOAL #2   Title  Pt will be perform HEP with S from wife to improve strength, balance, and gait.    Time  4    Period  Weeks    Status  On-going      PT SHORT TERM GOAL #3   Title  Pt will perform normal TUG with LRAD in </=15 sec. to decr. falls risk.    Time  4    Status  Revised      PT SHORT TERM GOAL #4   Title  Pt will improve gait speed with LRAD to >/=2.262ft/sec. to safely amb. in the community.    Time  4     Period  Weeks    Status  On-going      PT SHORT TERM GOAL #5   Title  Pt will amb. 500' with LRAD at MOD I level, over  even terrain to improve functional mobility.    Time  4    Period  Weeks    Status  On-going        PT Long Term Goals - 06/03/19 1010      PT LONG TERM GOAL #1   Title  Pt will improve cognitive TUG time with LRAD to </=15 sec. to decr. falls risk. TARGET DATE FOR ALL LTGS: 07/29/2019    Time  8    Period  Weeks    Status  On-going      PT LONG TERM GOAL #2   Title  Pt will amb. 1000' over paved surfaces with LRAD at MOD I level    Time  8    Period  Weeks    Status  On-going      PT LONG TERM GOAL #3   Title  Pt will be IND in progressed HEP to improve strength, balance, and gait.    Time  8    Period  Weeks    Status  On-going      PT LONG TERM GOAL #4   Title  Pt will report no falls over last four weeks to improve safety.    Time  8    Period  Weeks    Status  On-going      PT LONG TERM GOAL #5   Title  Pt will perform 8 of 10 reps of sit<>stand with proper technique, minimal UE use, no LOB, for improved transfer safety and efficiency.    Time  8    Period  Weeks    Status  New            Plan - 06/22/19 1356    Clinical Impression Statement  Despite education on improved gait safety and decreased fall risk with use of walker, pt is still using cane in clinic and in home.  Gait practice focused on gait with cane today, with pt demonstrating improved step length since beginning of therapy sessions; however with distractions and cognitive dual tasking (conversation), he reverts back to shuffling step pattern.  With exercises to address large amplitude movement patterns, pt needs frequent/constant multi-modal cues to achieve and maintain optimal movement patterns.  He will continue to benefit from skilled PT to address balance, gait and functional mobility.    Personal Factors and Comorbidities  Comorbidity 3+;Behavior Pattern    Comorbidities   see above    Examination-Activity Limitations  Carry;Locomotion Level;Stand;Lift    Examination-Participation Restrictions  Other   work-currently on short-term disability   Stability/Clinical Decision Making  Evolving/Moderate complexity    Rehab Potential  Good    PT Frequency  2x / week   per recert 06/02/2019; will need to monitor visit limit   PT Duration  8 weeks    PT Treatment/Interventions  ADLs/Self Care Home Management;Electrical Stimulation;DME Instruction;Gait training;Stair training;Functional mobility training;Therapeutic activities;Therapeutic exercise;Balance training;Orthotic Fit/Training;Patient/family education;Cognitive remediation;Neuromuscular re-education;Manual techniques;Vestibular    PT Next Visit Plan  PWR! Moves, intensity of movement (?try NuStep/SciFit), work on gait for foot clearance, step length turns, add in dual tasking activities to challenge; Check STGs next visit    Consulted and Agree with Plan of Care  Patient;Family member/caregiver    Family Member Consulted  wife: Myra       Patient will benefit from skilled therapeutic intervention in order to improve the following deficits and impairments:  Abnormal gait, Decreased coordination, Decreased range of motion, Difficulty walking, Impaired tone, Decreased endurance, Decreased knowledge  of use of DME, Decreased balance, Decreased mobility, Decreased cognition, Decreased strength, Impaired sensation, Postural dysfunction, Impaired flexibility, Pain(pt reported pain in foot 2/2 surgery during gait, PT will monitor closely but not directly treat)  Visit Diagnosis: Unsteadiness on feet  Other abnormalities of gait and mobility  Other lack of coordination  Other symptoms and signs involving the musculoskeletal system     Problem List Patient Active Problem List   Diagnosis Date Noted  . Proximal humerus fracture 11/18/2018  . Controlled substance agreement signed 04/27/2017  . Other insomnia  10/23/2016  . Anxiety disorder 10/08/2015  . Carotid artery stenosis, asymptomatic 10/08/2015  . Dyshydrosis 10/08/2015  . Essential hypertension 10/08/2015  . Hyperlipidemia LDL goal <100 10/08/2015  . Obesity (BMI 30-39.9) 10/08/2015  . Screening for prostate cancer 10/08/2015    Gean Maidens. 06/22/2019, 2:02 PM  Gean Maidens., PT   [facility] [address] [place], [place], [address] Phone: [phone]   Fax:  [phone]  Name: Endrit Gittins MRN: [id number] Date of Birth: 03-10-1961

## 2019-06-27 ENCOUNTER — Telehealth: Payer: Self-pay | Admitting: *Deleted

## 2019-06-27 NOTE — Telephone Encounter (Signed)
Pt states he will be bringing over paperwork to be completed by Dr. Paulla Dolly so he can return to work.

## 2019-06-27 NOTE — Telephone Encounter (Signed)
I told Brady [name] I could take the forms then give them to J. Albert to complete and I would ask Dr. Paulla Dolly for his return date for the letter.

## 2019-06-28 ENCOUNTER — Ambulatory Visit: Payer: BC Managed Care – PPO | Admitting: Physical Therapy

## 2019-06-28 ENCOUNTER — Encounter: Payer: Self-pay | Admitting: Occupational Therapy

## 2019-06-28 ENCOUNTER — Ambulatory Visit: Payer: BC Managed Care – PPO | Admitting: Occupational Therapy

## 2019-06-28 ENCOUNTER — Other Ambulatory Visit: Payer: Self-pay

## 2019-06-28 ENCOUNTER — Encounter: Payer: Self-pay | Admitting: Physical Therapy

## 2019-06-28 DIAGNOSIS — M6281 Muscle weakness (generalized): Secondary | ICD-10-CM

## 2019-06-28 DIAGNOSIS — R29818 Other symptoms and signs involving the nervous system: Secondary | ICD-10-CM

## 2019-06-28 DIAGNOSIS — R2681 Unsteadiness on feet: Secondary | ICD-10-CM

## 2019-06-28 DIAGNOSIS — R2689 Other abnormalities of gait and mobility: Secondary | ICD-10-CM

## 2019-06-28 DIAGNOSIS — R41844 Frontal lobe and executive function deficit: Secondary | ICD-10-CM

## 2019-06-28 DIAGNOSIS — R278 Other lack of coordination: Secondary | ICD-10-CM

## 2019-06-28 DIAGNOSIS — R29898 Other symptoms and signs involving the musculoskeletal system: Secondary | ICD-10-CM

## 2019-06-28 DIAGNOSIS — M25611 Stiffness of right shoulder, not elsewhere classified: Secondary | ICD-10-CM

## 2019-06-28 DIAGNOSIS — M25612 Stiffness of left shoulder, not elsewhere classified: Secondary | ICD-10-CM

## 2019-06-28 NOTE — Telephone Encounter (Signed)
Pt presents to office with paperwork for disability, states he needs the restrictions to be a 4 hour day and intermittent standing, this his boss will understand, and along with the forms the return to work around the 1st of December and restrictions a note on letterhead.

## 2019-06-28 NOTE — Therapy (Signed)
[facility] [address] [place], [place], [address] Phone: [phone]   Fax:  [phone]  Physical Therapy Treatment  Patient Details  Name: Brady [name] MRN: [id number] Date of Birth: 1961-06-02 Referring Provider (PT): Dr. Rexene Alberts   Encounter Date: 06/28/2019  PT End of Session - 06/28/19 1331    Visit Number  6    Number of Visits  18   per recert 80/10/2120   Date for PT Re-Evaluation  08/31/19    Authorization Type  [employer]: 30 visit limit, 12 used for ortho PT (s/p ORIF), OT and PT count as 1 visit if on same day per chart.    Authorization - Visit Number  18   including previous visits from PT earlier this year-   Authorization - Number of Visits  30    PT Start Time  1020    PT Stop Time  1100    PT Time Calculation (min)  40 min    Equipment Utilized During Treatment  Other (comment)   min guard to S prn   Activity Tolerance  Patient tolerated treatment well    Behavior During Therapy  WFL for tasks assessed/performed       Past Medical History:  Diagnosis Date  . Anxiety   . Carotid artery stenosis   . Dyshydrosis   . Hyperlipidemia   . Hypertension     Past Surgical History:  Procedure Laterality Date  . ORIF HUMERUS FRACTURE Right 11/18/2018   Procedure: OPEN REDUCTION INTERNAL FIXATION (ORIF) PROXIMAL HUMERUS FRACTURE;  Surgeon: Justice Britain, MD;  Location: [facility];  Service: Orthopedics;  Laterality: Right;    There were no vitals filed for this visit.  Subjective Assessment - 06/28/19 1023    Subjective  Having some neck stiffness; not sure if it's our new mattress or just me.    Patient is accompained by:  Family member   Brady [name]   Pertinent History  HTN, HLD, anxiety, carotid stenosis, obesity, hx of R humerus ORIF and L scapula fx due to 3/20 fall, hammer toe surgery on 04/19/19 (digits 2-4)    Patient Stated Goals  Improve strength, improve endurance, improve balance (Zoloft made  balance worse)    Currently in Pain?  No/denies                       [facility] Adult PT Treatment/Exercise - 06/28/19 0001      Transfers   Transfers  Sit to Stand;Stand to Sit    Sit to Stand  5: Supervision;With upper extremity assist;From chair/3-in-1    Five time sit to stand comments   18.75    Stand to Sit  5: Supervision;To chair/3-in-1;With upper extremity assist      Ambulation/Gait   Ambulation/Gait  Yes    Ambulation/Gait Assistance  5: Supervision    Assistive device  Straight cane    Gait Pattern  Step-through pattern;Decreased stride length;Decreased dorsiflexion - left;Decreased dorsiflexion - right;Decreased hip/knee flexion - right;Decreased hip/knee flexion - left;Shuffle;Decreased step length - right;Decreased step length - left;Narrow base of support;Poor foot clearance - left;Poor foot clearance - right;Festinating;Decreased arm swing - right;Decreased arm swing - left;Decreased weight shift to right;Decreased weight shift to left    Ambulation Surface  Level;Indoor    Gait velocity  12.87 sec = 2.55 ft/sec    Pre-Gait Activities  Practiced turning techniques:  reviewed side step turn (clock turn, with pt able to make turn in 8-10 steps, in  6-7 seconds each direction.  Initial cues to remind pt of technique    Gait Comments  Pt noted to have increased step length overall with gait, but continues to demo decreased heelstrike, foot flat placement.      Standardized Balance Assessment   Standardized Balance Assessment  Timed Up and Go Test;Berg Balance Test      Berg Balance Test   Sit to Stand  Able to stand without using hands and stabilize independently    Standing Unsupported  Able to stand safely 2 minutes    Sitting with Back Unsupported but Feet Supported on Floor or Stool  Able to sit safely and securely 2 minutes    Stand to Sit  Sits safely with minimal use of hands    Transfers  Able to transfer safely, minor use of hands    Standing  Unsupported with Eyes Closed  Able to stand 10 seconds with supervision    Standing Ubsupported with Feet Together  Able to place feet together independently and stand 1 minute safely    From Standing, Reach Forward with Outstretched Arm  Can reach forward >12 cm safely (5")    From Standing Position, Pick up Object from Floor  Able to pick up shoe safely and easily    From Standing Position, Turn to Look Behind Over each Shoulder  Looks behind from both sides and weight shifts well    Turn 360 Degrees  Able to turn 360 degrees safely but slowly   7.56 sec to L, 6.44 sec to R   Standing Unsupported, Alternately Place Feet on Step/Stool  Able to stand independently and safely and complete 8 steps in 20 seconds    Standing Unsupported, One Foot in Front  Able to plae foot ahead of the other independently and hold 30 seconds    Standing on One Leg  Tries to lift leg/unable to hold 3 seconds but remains standing independently    Total Score  48      Timed Up and Go Test   TUG  Normal TUG;Cognitive TUG    Normal TUG (seconds)  15.12    Cognitive TUG (seconds)  15.34      High Level Balance   High Level Balance Comments  Reviewed lateral weigthshifting and turning technique as part of HEP; reviewed sit<.stand as part of HEP        PWR [facility]) - 06/28/19 1052    PWR! exercises  Moves in standing    PWR! Up  x 10 reps    PWR! Rock  x 5 reps each side    PWR! Twist  x 5 reps each side    PWR Step  x 8 reps each side    Comments  Verbal and visual cues provided for correct technique and amplitude of movement.  PWR! Up for posture, PWR! Rock for Brady [name], [place]! Twist for trunk flexibility, and PWR! Step for intiation of stepping.          PT Education - 06/28/19 1331    Education Details  Added PWR! Moves to HEP; educated in importance of daily performance of HEP; progress towards goals, POC    Person(s) Educated  Patient    Methods  Explanation;Demonstration;Handout    Comprehension   Verbalized understanding;Returned demonstration       PT Short Term Goals - 06/28/19 1044      PT SHORT TERM GOAL #1   Title  Perform FGA or Mini BESTest and write goals as indicated.  TARGET DATE FOR ALL STGS: 07/01/19 (Improve Berg score by at least 5 points for decr. fall risk)    Baseline  Berg 06/14/2019:  35/56; 48/56 06/28/2019    Time  4    Period  Weeks    Status  Achieved      PT SHORT TERM GOAL #2   Title  Pt will be perform HEP with S from wife to improve strength, balance, and gait.    Time  4    Period  Weeks    Status  Partially Met      PT SHORT TERM GOAL #3   Title  Pt will perform normal TUG with LRAD in </=15 sec. to decr. falls risk.    Time  4    Status  Partially Met      PT SHORT TERM GOAL #4   Title  Pt will improve gait speed with LRAD to >/=2.59f/sec. to safely amb. in the community.    Time  4    Period  Weeks    Status  Partially Met      PT SHORT TERM GOAL #5   Title  Pt will amb. 500' with LRAD at MOD I level, over even terrain to improve functional mobility.    Time  4    Period  Weeks    Status  On-going        PT Long Term Goals - 06/03/19 1010      PT LONG TERM GOAL #1   Title  Pt will improve cognitive TUG time with LRAD to </=15 sec. to decr. falls risk. TARGET DATE FOR ALL LTGS: 07/29/2019    Time  8    Period  Weeks    Status  On-going      PT LONG TERM GOAL #2   Title  Pt will amb. 1000' over paved surfaces with LRAD at MOD I level    Time  8    Period  Weeks    Status  On-going      PT LONG TERM GOAL #3   Title  Pt will be IND in progressed HEP to improve strength, balance, and gait.    Time  8    Period  Weeks    Status  On-going      PT LONG TERM GOAL #4   Title  Pt will report no falls over last four weeks to improve safety.    Time  8    Period  Weeks    Status  On-going      PT LONG TERM GOAL #5   Title  Pt will perform 8 of 10 reps of sit<>stand with proper technique, minimal UE use, no LOB, for improved  transfer safety and efficiency.    Time  8    Period  Weeks    Status  New            Plan - 06/28/19 1335    Clinical Impression Statement  Assessed PT STGs this visit, with pt meeting STG 1 and partially meeting STG 2, 3, 4.  Pt has significantly improved Berg Balance score, decreasing fall risk and has improved TUG and gait velocity scores, just not to goal level.  Pt has only been able to be seen once per week, despite increasing frequency of POC to 2x/wk (partially may have been due to scheduling conflicts in clinic).  Pt's 2x/wk frequency will start week of 11/10.    Personal Factors and Comorbidities  Comorbidity  3+;Behavior Pattern    Comorbidities  see above    Examination-Activity Limitations  Carry;Locomotion Level;Stand;Lift    Examination-Participation Restrictions  Other   work-currently on short-term disability   Stability/Clinical Decision Making  Evolving/Moderate complexity    Rehab Potential  Good    PT Frequency  2x / week   per recert 85/0/2774; will need to monitor visit limit   PT Duration  8 weeks    PT Treatment/Interventions  ADLs/Self Care Home Management;Electrical Stimulation;DME Instruction;Gait training;Stair training;Functional mobility training;Therapeutic activities;Therapeutic exercise;Balance training;Orthotic Fit/Training;Patient/family education;Cognitive remediation;Neuromuscular re-education;Manual techniques;Vestibular    PT Next Visit Plan  Review PWR! Moves in standing, intensity of movement (?try NuStep/SciFit), work on gait for foot clearance, step length turns, add in dual tasking activities to challenge    Consulted and Agree with Plan of Care  Patient    Family Member Consulted  --       Patient will benefit from skilled therapeutic intervention in order to improve the following deficits and impairments:  Abnormal gait, Decreased coordination, Decreased range of motion, Difficulty walking, Impaired tone, Decreased endurance, Decreased  knowledge of use of DME, Decreased balance, Decreased mobility, Decreased cognition, Decreased strength, Impaired sensation, Postural dysfunction, Impaired flexibility, Pain(pt reported pain in foot 2/2 surgery during gait, PT will monitor closely but not directly treat)  Visit Diagnosis: Other abnormalities of gait and mobility  Unsteadiness on feet  Other symptoms and signs involving the nervous system  Muscle weakness (generalized)     Problem List Patient Active Problem List   Diagnosis Date Noted  . Proximal humerus fracture 11/18/2018  . Controlled substance agreement signed 04/27/2017  . Other insomnia 10/23/2016  . Anxiety disorder 10/08/2015  . Carotid artery stenosis, asymptomatic 10/08/2015  . Dyshydrosis 10/08/2015  . Essential hypertension 10/08/2015  . Hyperlipidemia LDL goal <100 10/08/2015  . Obesity (BMI 30-39.9) 10/08/2015  . Screening for prostate cancer 10/08/2015    Brady [name] W. 06/28/2019, 5:43 PM Brady Butt., PT  [facility] [address] [place], [place], [address] Phone: [phone]   Fax:  [phone]  Name: Brady [name] MRN: [id number] Date of Birth: 1961/03/20

## 2019-06-28 NOTE — Therapy (Signed)
[facility] [address] [place], [place], [address] Phone: [phone]   Fax:  [phone]  Occupational Therapy Treatment  Patient Details  Name: Brady [name] MRN: [id number] Date of Birth: [birth year] Referring Provider (OT): Dr. Rexene Alberts   Encounter Date: 06/28/2019  OT End of Session - 06/28/19 1547    Visit Number  6    Number of Visits  13    Date for OT Re-Evaluation  07/30/19    Authorization Type  BCBS    Authorization - Visit Number  6   12/30 visits used previously   Authorization - Number of Visits  13    OT Start Time  1105    OT Stop Time  1145    OT Time Calculation (min)  40 min    Activity Tolerance  Patient tolerated treatment well    Behavior During Therapy  Restless;Impulsive       Past Medical History:  Diagnosis Date  . Anxiety   . Carotid artery stenosis   . Dyshydrosis   . Hyperlipidemia   . Hypertension     Past Surgical History:  Procedure Laterality Date  . ORIF HUMERUS FRACTURE Right 11/18/2018   Procedure: OPEN REDUCTION INTERNAL FIXATION (ORIF) PROXIMAL HUMERUS FRACTURE;  Surgeon: Justice Britain, MD;  Location: [facility];  Service: Orthopedics;  Laterality: Right;    There were no vitals filed for this visit.  Subjective Assessment - 06/28/19 1106    Subjective   stiffness in neck, may have slept wrong    Pertinent History  Parkinsonism,L scapular fx and right humeral fx in March 2020-no precautions, hammertoe surgery    Patient Stated Goals  maximize independence    Currently in Pain?  No/denies            Simulated donning/doffing with bag/theraband loop with focus/min-mod cues for large amplitude movements.  Recommended pt sit to get pants on and pull to stand up (practiced with loop).  Practiced donning/doffing sock with min cues for large amplitude movement strategies.          OT Education - 06/28/19 1546    Education Details  PWR! supine (basic 4)  x20 each with mod cues for incr movement amplitude, technique, and transition to functional movements supine>sitting (practiced with strategies)    Person(s) Educated  Patient;Spouse    Methods  Explanation;Demonstration;Verbal cues;Tactile cues    Comprehension  Verbalized understanding;Returned demonstration;Verbal cues required;Tactile cues required       OT Short Term Goals - 06/21/19 1033      OT SHORT TERM GOAL #1   Title  I with HEP.    Time  4    Period  Weeks    Status  On-going   needs reinforcement, not performing     OT SHORT TERM GOAL #2   Title  Pt will demonstrate understanding of adapted strategies to maximize safety and independence with ADLs/IADLs.    Time  4    Period  Weeks    Status  On-going   needs reinforcement     OT SHORT TERM GOAL #3   Title  Pt will demonstrate ability to retrieve a lightweight object with RUE at 100 shoulder flexion with -15 elbow extension    Baseline  90, -20    Time  4    Period  Weeks    Status  Achieved   100, -15     OT SHORT TERM GOAL #4   Title  Pt will demonstrate  ability to retrieve a lightweight object with LUE at 125 shoulder flexion and -15 elbow ext.    Baseline  120, -20    Time  4    Period  Weeks    Status  Achieved   125, -10     OT SHORT TERM GOAL #5   Title  Pt will demonstrate ability to write a short paragraph with no significant decrease in letter size and 100% legibility.    Time  4    Period  Weeks    Status  Achieved        OT Long Term Goals - 06/21/19 1543      OT LONG TERM GOAL #1   Title  Pt will verbalize understanding of ways to prevent future PD related complication and community resources.    Time  8    Period  Weeks    Status  On-going    Target Date  08/20/19      OT LONG TERM GOAL #2   Title  Pt will demonstrate improved ease with dressing as evidenced by decreasing PPT#4 to 25 secs or less    Baseline  30.28 secs    Time  8    Period  Weeks    Status  On-going      OT  LONG TERM GOAL #3   Title  Pt will demonstrate increased ease with fastening buttons as evidenced by decreasing 3 button/ unbutton to 75 secs or less    Baseline  84 secs    Time  8    Period  Weeks    Status  On-going      OT LONG TERM GOAL #4   Title  Pt will demonstrate improved bilateral UE functional use as evidenced by increasing bilateral box/ blocks score by 3 blocks.    Baseline  RUE 43, LUE 44    Time  8    Period  Weeks    Status  On-going            Plan - 06/28/19 1547    Clinical Impression Statement  Pt is progressing slowly towards goals.  He continues to need cueing for PWR! supine and needs continued education regarding large amplitude movement strategies for ADLs.    OT Occupational Profile and History  Detailed Assessment- Review of Records and additional review of physical, cognitive, psychosocial history related to current functional performance    Occupational performance deficits (Please refer to evaluation for details):  ADL's;IADL's;Work;Leisure;Social Participation    Body Structure / Function / Physical Skills  ADL;Decreased knowledge of precautions;Flexibility;ROM;UE functional use;Mobility;FMC;Decreased knowledge of use of DME;Balance;Dexterity;Gait;GMC;Strength;Pain;Endurance;Coordination;IADL;Tone    Cognitive Skills  Attention;Memory;Problem Solve;Safety Awareness;Sequencing;Thought    Rehab Potential  Good    Clinical Decision Making  Several treatment options, min-mod task modification necessary    Comorbidities Affecting Occupational Performance:  May have comorbidities impacting occupational performance    Modification or Assistance to Complete Evaluation   Min-Moderate modification of tasks or assist with assess necessary to complete eval   extra time, verbal cues   OT Frequency  2x / week    OT Duration  8 weeks   updated frequency   OT Treatment/Interventions  Self-care/ADL training;Therapeutic exercise;Patient/family education;Neuromuscular  education;Aquatic Therapy;Moist Heat;Paraffin;Fluidtherapy;Energy conservation;Therapeutic activities;Balance training;Cognitive remediation/compensation;Passive range of motion;Manual Therapy;DME and/or AE instruction;Contrast Bath;Ultrasound;Cryotherapy;Functional Mobility Training    Plan  ADL strategies (donning doffing jacket/shirt), bag exercises for simulated ADLs    OT Home Exercise Plan  coordination issued    Consulted  and Agree with Plan of Care  Patient;Family member/caregiver    Family Member Consulted  wife       Patient will benefit from skilled therapeutic intervention in order to improve the following deficits and impairments:   Body Structure / Function / Physical Skills: ADL, Decreased knowledge of precautions, Flexibility, ROM, UE functional use, Mobility, FMC, Decreased knowledge of use of DME, Balance, Dexterity, Gait, GMC, Strength, Pain, Endurance, Coordination, IADL, Tone Cognitive Skills: Attention, Memory, Problem Solve, Safety Awareness, Sequencing, Thought     Visit Diagnosis: Other symptoms and signs involving the musculoskeletal system  Other symptoms and signs involving the nervous system  Other lack of coordination  Stiffness of left shoulder, not elsewhere classified  Stiffness of right shoulder, not elsewhere classified  Frontal lobe and executive function deficit  Muscle weakness (generalized)  Unsteadiness on feet  Other abnormalities of gait and mobility    Problem List Patient Active Problem List   Diagnosis Date Noted  . Proximal humerus fracture 11/18/2018  . Controlled substance agreement signed 04/27/2017  . Other insomnia 10/23/2016  . Anxiety disorder 10/08/2015  . Carotid artery stenosis, asymptomatic 10/08/2015  . Dyshydrosis 10/08/2015  . Essential hypertension 10/08/2015  . Hyperlipidemia LDL goal <100 10/08/2015  . Obesity (BMI 30-39.9) 10/08/2015  . Screening for prostate cancer 10/08/2015     [facility] 06/28/2019, 3:49 PM  [facility] [address] [place], [place], [address] Phone: [phone]   Fax:  [phone]  Name: Brady [name] MRN: [id number] Date of Birth: 21-Mar-1961   Willa Frater, OTR/L [facility] [address] [place], [place]  [address] [phone] phone [phone] 06/28/19 3:49 PM

## 2019-06-28 NOTE — Patient Instructions (Signed)
  Perform standing PWR! Moves, 1x/day, 10-20 reps

## 2019-06-29 NOTE — Telephone Encounter (Signed)
That's fine

## 2019-06-30 ENCOUNTER — Encounter: Payer: Self-pay | Admitting: Podiatry

## 2019-07-12 ENCOUNTER — Ambulatory Visit: Payer: BC Managed Care – PPO | Admitting: Occupational Therapy

## 2019-07-12 ENCOUNTER — Ambulatory Visit: Payer: BC Managed Care – PPO | Admitting: Physical Therapy

## 2019-07-14 ENCOUNTER — Ambulatory Visit: Payer: BC Managed Care – PPO | Admitting: Physical Therapy

## 2019-07-14 ENCOUNTER — Encounter: Payer: Self-pay | Admitting: Physical Therapy

## 2019-07-14 ENCOUNTER — Other Ambulatory Visit: Payer: Self-pay

## 2019-07-14 ENCOUNTER — Encounter: Payer: Self-pay | Admitting: Occupational Therapy

## 2019-07-14 ENCOUNTER — Ambulatory Visit: Payer: BC Managed Care – PPO | Attending: Neurology | Admitting: Occupational Therapy

## 2019-07-14 DIAGNOSIS — R2681 Unsteadiness on feet: Secondary | ICD-10-CM | POA: Insufficient documentation

## 2019-07-14 DIAGNOSIS — M25612 Stiffness of left shoulder, not elsewhere classified: Secondary | ICD-10-CM | POA: Insufficient documentation

## 2019-07-14 DIAGNOSIS — R29818 Other symptoms and signs involving the nervous system: Secondary | ICD-10-CM | POA: Diagnosis present

## 2019-07-14 DIAGNOSIS — R41844 Frontal lobe and executive function deficit: Secondary | ICD-10-CM | POA: Diagnosis present

## 2019-07-14 DIAGNOSIS — R29898 Other symptoms and signs involving the musculoskeletal system: Secondary | ICD-10-CM | POA: Insufficient documentation

## 2019-07-14 DIAGNOSIS — R278 Other lack of coordination: Secondary | ICD-10-CM | POA: Diagnosis present

## 2019-07-14 DIAGNOSIS — M25611 Stiffness of right shoulder, not elsewhere classified: Secondary | ICD-10-CM

## 2019-07-14 DIAGNOSIS — R2689 Other abnormalities of gait and mobility: Secondary | ICD-10-CM | POA: Diagnosis present

## 2019-07-14 DIAGNOSIS — M6281 Muscle weakness (generalized): Secondary | ICD-10-CM | POA: Diagnosis present

## 2019-07-14 NOTE — Therapy (Signed)
[facility] [address] [place], [place], [address] Phone: [phone]   Fax:  [phone]  Physical Therapy Treatment  Patient Details  Name: Brady [name] MRN: [id number] Date of Birth: August 07, 1961 Referring Provider (PT): Dr. Rexene [name]   Encounter Date: 07/14/2019  PT End of Session - 07/14/19 2045    Visit Number  7    Number of Visits  18   per recert 92/10/3005   Date for PT Re-Evaluation  08/31/19    Authorization Type  [employer]: 30 visit limit, 12 used for ortho PT (s/p ORIF), OT and PT count as 1 visit if on same day per chart.    Authorization - Visit Number  19   including previous visits from PT earlier this year-   Authorization - Number of Visits  30    PT Start Time  1232    PT Stop Time  1314    PT Time Calculation (min)  42 min    Equipment Utilized During Treatment  Other (comment)   min guard to S prn   Activity Tolerance  Patient tolerated treatment well    Behavior During Therapy  WFL for tasks assessed/performed       Past Medical History:  Diagnosis Date  . Anxiety   . Carotid artery stenosis   . Dyshydrosis   . Hyperlipidemia   . Hypertension     Past Surgical History:  Procedure Laterality Date  . ORIF HUMERUS FRACTURE Right 11/18/2018   Procedure: OPEN REDUCTION INTERNAL FIXATION (ORIF) PROXIMAL HUMERUS FRACTURE;  Surgeon: Justice Britain, MD;  Location: [facility];  Service: Orthopedics;  Laterality: Right;    There were no vitals filed for this visit.  Subjective Assessment - 07/14/19 1233    Subjective  No changes, started doing some walking in the street with the cane.    Patient is accompained by:  Family member   [name]   Pertinent History  HTN, HLD, anxiety, carotid stenosis, obesity, hx of R humerus ORIF and L scapula fx due to 3/20 fall, hammer toe surgery on 04/19/19 (digits 2-4)    Patient Stated Goals  Improve strength, improve endurance, improve balance (Zoloft made  balance worse)    Currently in Pain?  No/denies                       [facility] Adult PT Treatment/Exercise - 07/14/19 1237      Transfers   Comments  Sit<>stand x 5 reps, then TUG shuttle (sit<>stand from mat, walk 10 ft, stand>sit at chair, then back), repeating full 5 reps, 1:41  Cues for full upright stand, deliberate heelstrike and step length, full turn to sit safely.      Exercises   Exercises  Knee/Hip      Knee/Hip Exercises: Aerobic   Nustep  4 extremities x 8 minutes , cues to keep RPM >60 for improved lower extremity flexibility/decreased stiffness.  With conversation tasks, pt decreases RPM to 30's., cues to inrease back to baseline RPM.  Cues for 30 second interval of increased intensity.          Balance Exercises - 07/14/19 1257      Balance Exercises: Standing   Stepping Strategy  Anterior;Posterior;Lateral;UE support;10 reps   parallel bars; cues for intesnity, foot clearance/placement   Other Standing Exercises  Stagger stance forward/back weightshift x 10 reps each side, UE supported at parallel bars, cues for correct technique, especially hinge at hips.  Progressed to using  forward/back rocking motion with short distance gait and turning practice.  Progressed to walking to find cones, turning to bring cones back to central location (simulating getting items out of refrigerator).  VCs and tactile cues for weightshifting through hips, wide BOS to complete turn without pivoting or festination.        PT Education - 07/14/19 2043    Education Details  Standing stride stance weightshifting, turning practice    Person(s) Educated  Patient    Methods  Explanation;Demonstration;Verbal cues;Handout    Comprehension  Verbalized understanding;Returned demonstration;Verbal cues required     In parallel bars, marching in place, x 10 reps, cues for widened BOS.  PT Short Term Goals - 06/28/19 1044      PT SHORT TERM GOAL #1   Title  Perform FGA or Mini  BESTest and write goals as indicated. TARGET DATE FOR ALL STGS: 07/01/19 (Improve Berg score by at least 5 points for decr. fall risk)    Baseline  Berg 06/14/2019:  35/56; 48/56 06/28/2019    Time  4    Period  Weeks    Status  Achieved      PT SHORT TERM GOAL #2   Title  Pt will be perform HEP with S from wife to improve strength, balance, and gait.    Time  4    Period  Weeks    Status  Partially Met      PT SHORT TERM GOAL #3   Title  Pt will perform normal TUG with LRAD in </=15 sec. to decr. falls risk.    Time  4    Status  Partially Met      PT SHORT TERM GOAL #4   Title  Pt will improve gait speed with LRAD to >/=2.53f/sec. to safely amb. in the community.    Time  4    Period  Weeks    Status  Partially Met      PT SHORT TERM GOAL #5   Title  Pt will amb. 500' with LRAD at MOD I level, over even terrain to improve functional mobility.    Time  4    Period  Weeks    Status  On-going        PT Long Term Goals - 06/03/19 1010      PT LONG TERM GOAL #1   Title  Pt will improve cognitive TUG time with LRAD to </=15 sec. to decr. falls risk. TARGET DATE FOR ALL LTGS: 07/29/2019    Time  8    Period  Weeks    Status  On-going      PT LONG TERM GOAL #2   Title  Pt will amb. 1000' over paved surfaces with LRAD at MOD I level    Time  8    Period  Weeks    Status  On-going      PT LONG TERM GOAL #3   Title  Pt will be IND in progressed HEP to improve strength, balance, and gait.    Time  8    Period  Weeks    Status  On-going      PT LONG TERM GOAL #4   Title  Pt will report no falls over last four weeks to improve safety.    Time  8    Period  Weeks    Status  On-going      PT LONG TERM GOAL #5   Title  Pt will perform 8 of  10 reps of sit<>stand with proper technique, minimal UE use, no LOB, for improved transfer safety and efficiency.    Time  8    Period  Weeks    Status  New            Plan - 07/14/19 2045    Clinical Impression Statement   Utilized NuStep at beginning of session today to help with improved flexibility to decrease rigidity and stifness; then worked on short distance gait, turning practice, as well as balance activities.  (per pt request, as he wants to work on balance).  Used multi-modal cues, including verbal and tactile and visual cues from mirror, with continued feedback needed for widened BOS and for foot clearance, heelstrike with standing activities.  He continues to have narrow BOS and some festination with turns, and will benefit from continued balance and gait training.    Personal Factors and Comorbidities  Comorbidity 3+;Behavior Pattern    Comorbidities  see above    Examination-Activity Limitations  Carry;Locomotion Level;Stand;Lift    Examination-Participation Restrictions  Other   work-currently on short-term disability   Stability/Clinical Decision Making  Evolving/Moderate complexity    Rehab Potential  Good    PT Frequency  2x / week   per recert 46/10/7033; will need to monitor visit limit   PT Duration  8 weeks    PT Treatment/Interventions  ADLs/Self Care Home Management;Electrical Stimulation;DME Instruction;Gait training;Stair training;Functional mobility training;Therapeutic activities;Therapeutic exercise;Balance training;Orthotic Fit/Training;Patient/family education;Cognitive remediation;Neuromuscular re-education;Manual techniques;Vestibular    PT Next Visit Plan  Warm up with NuStep or Scifit (work on intensity as well as dual tasking); gait and balance activities, foot clearance, SLS, turning with gait; dual tasking activities    Consulted and Agree with Plan of Care  Patient       Patient will benefit from skilled therapeutic intervention in order to improve the following deficits and impairments:  Abnormal gait, Decreased coordination, Decreased range of motion, Difficulty walking, Impaired tone, Decreased endurance, Decreased knowledge of use of DME, Decreased balance, Decreased  mobility, Decreased cognition, Decreased strength, Impaired sensation, Postural dysfunction, Impaired flexibility, Pain(pt reported pain in foot 2/2 surgery during gait, PT will monitor closely but not directly treat)  Visit Diagnosis: Other abnormalities of gait and mobility  Unsteadiness on feet  Other symptoms and signs involving the nervous system     Problem List Patient Active Problem List   Diagnosis Date Noted  . Proximal humerus fracture 11/18/2018  . Controlled substance agreement signed 04/27/2017  . Other insomnia 10/23/2016  . Anxiety disorder 10/08/2015  . Carotid artery stenosis, asymptomatic 10/08/2015  . Dyshydrosis 10/08/2015  . Essential hypertension 10/08/2015  . Hyperlipidemia LDL goal <100 10/08/2015  . Obesity (BMI 30-39.9) 10/08/2015  . Screening for prostate cancer 10/08/2015    Brady Lopezperez W. 07/14/2019, 8:51 PM Frazier Butt., PT   [facility] [address] [place], [place], [address] Phone: [phone]   Fax:  [phone]  Name: Brady [name] MRN: [id number] Date of Birth: 1961-06-12

## 2019-07-14 NOTE — Patient Instructions (Signed)
Stride stance weigthshift in standing-forward/back weightshifting, 10 reps each foot position  -To be used for reaching, helping with turning, changing directions

## 2019-07-14 NOTE — Therapy (Signed)
[facility] [address] [place], [place], [address] Phone: [phone]   Fax:  [phone]  Occupational Therapy Treatment  Patient Details  Name: Brady [name] MRN: [id number] Date of Birth: Jan 09, 1961 Referring Provider (OT): Dr. Rexene Alberts   Encounter Date: 07/14/2019  OT End of Session - 07/14/19 1410    Visit Number  7    Number of Visits  13    Date for OT Re-Evaluation  07/30/19    Authorization Type  BCBS    Authorization - Visit Number  7   12/30 visits used previously   Authorization - Number of Visits  13    OT Start Time  1320    OT Stop Time  1400    OT Time Calculation (min)  40 min    Activity Tolerance  Patient tolerated treatment well    Behavior During Therapy  Restless;Impulsive       Past Medical History:  Diagnosis Date  . Anxiety   . Carotid artery stenosis   . Dyshydrosis   . Hyperlipidemia   . Hypertension     Past Surgical History:  Procedure Laterality Date  . ORIF HUMERUS FRACTURE Right 11/18/2018   Procedure: OPEN REDUCTION INTERNAL FIXATION (ORIF) PROXIMAL HUMERUS FRACTURE;  Surgeon: Justice Britain, MD;  Location: [facility];  Service: Orthopedics;  Laterality: Right;    There were no vitals filed for this visit.  Subjective Assessment - 07/14/19 1410    Subjective   stiffness in neck, may have slept wrong    Pertinent History  Parkinsonism,L scapular fx and right humeral fx in March 2020-no precautions, hammertoe surgery    Patient Stated Goals  maximize independence    Currently in Pain?  No/denies              Treatment: Supine PWR! Up, rock and twist 10-20 reps each min v.c for larger amplitude movements Bag exercises for simulated ADLS: donning shirt, drying back donning pants, pulling up socks, min-mod v.c. 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    Time  4    Period  Weeks    Status  On-going   needs reinforcement, not performing     OT SHORT TERM GOAL #2   Title  Pt will demonstrate understanding of adapted strategies to maximize safety and independence with ADLs/IADLs.    Time  4    Period  Weeks    Status  On-going   needs reinforcement     OT SHORT TERM GOAL #3   Title  Pt will demonstrate ability to retrieve a lightweight object with RUE at 100 shoulder flexion with -15 elbow extension    Baseline  90, -20    Time  4    Period  Weeks    Status  Achieved   100, -15     OT SHORT TERM GOAL #4   Title  Pt will demonstrate ability to retrieve a lightweight object with LUE at 125 shoulder flexion and -15 elbow ext.    Baseline  120, -20    Time  4    Period  Weeks    Status  Achieved   125, -10     OT SHORT TERM GOAL #5   Title  Pt will demonstrate ability to write a short paragraph with no significant decrease in letter size and 100% legibility.    Time  4    Period  Weeks    Status  Achieved        OT Long Term Goals - 06/21/19 1543      OT LONG TERM GOAL #1   Title  Pt will verbalize understanding of ways to prevent future PD related complication and community resources.    Time  8    Period  Weeks    Status  On-going    Target Date  08/20/19      OT LONG TERM GOAL #2   Title  Pt will demonstrate improved ease with dressing as evidenced by decreasing PPT#4 to 25 secs or less    Baseline  30.28 secs    Time  8    Period  Weeks    Status  On-going      OT LONG TERM GOAL #3   Title  Pt will demonstrate increased ease with fastening buttons as evidenced by decreasing 3 button/ unbutton to 75 secs or less    Baseline  84 secs    Time  8    Period  Weeks    Status  On-going      OT LONG TERM GOAL #4   Title  Pt will demonstrate improved  bilateral UE functional use as evidenced by increasing bilateral box/ blocks score by 3 blocks.    Baseline  RUE 43, LUE 44    Time  8    Period  Weeks    Status  On-going            Plan - 07/14/19 1411    Clinical Impression Statement  Pt is continues to progress slowly towards goals.He requires v.c for larger amplitude movements during functional activities.    OT Occupational Profile and History  Detailed Assessment- Review of Records and additional review of physical, cognitive, psychosocial history related to current functional performance    Occupational performance deficits (Please refer to evaluation for details):  ADL's;IADL's;Work;Leisure;Social Participation    Body Structure / Function / Physical Skills  ADL;Decreased knowledge of precautions;Flexibility;ROM;UE functional use;Mobility;FMC;Decreased knowledge of use of DME;Balance;Dexterity;Gait;GMC;Strength;Pain;Endurance;Coordination;IADL;Tone    Cognitive Skills  Attention;Memory;Problem Solve;Safety Awareness;Sequencing;Thought    Rehab Potential  Good    Clinical Decision Making  Several treatment options, min-mod task modification necessary    Comorbidities Affecting Occupational Performance:  May have comorbidities impacting occupational performance    Modification or Assistance to Complete Evaluation   Min-Moderate modification of tasks or assist with assess necessary to complete eval   extra time, verbal cues   OT Frequency  2x / week    OT Duration  8 weeks   updated frequency   OT Treatment/Interventions  Self-care/ADL training;Therapeutic exercise;Patient/family education;Neuromuscular education;Aquatic Therapy;Moist Heat;Paraffin;Fluidtherapy;Energy conservation;Therapeutic activities;Balance training;Cognitive remediation/compensation;Passive range of motion;Manual Therapy;DME and/or AE instruction;Contrast Bath;Ultrasound;Cryotherapy;Functional Mobility Training    Plan  ADL strategies (donning doffing  jacket/shirt), simulatated work activities    OT Home Exercise Plan  coordination issued    Consulted and Agree with Plan of Care  Patient;Family member/caregiver    Family Member Consulted  wife       Patient will benefit from skilled therapeutic intervention in order to improve the following deficits and impairments:   Body Structure / Function /  Physical Skills: ADL, Decreased knowledge of precautions, Flexibility, ROM, UE functional use, Mobility, FMC, Decreased knowledge of use of DME, Balance, Dexterity, Gait, GMC, Strength, Pain, Endurance, Coordination, IADL, Tone Cognitive Skills: Attention, Memory, Problem Solve, Safety Awareness, Sequencing, Thought     Visit Diagnosis: Other symptoms and signs involving the musculoskeletal system  Other symptoms and signs involving the nervous system  Other lack of coordination  Stiffness of left shoulder, not elsewhere classified  Stiffness of right shoulder, not elsewhere classified  Frontal lobe and executive function deficit  Muscle weakness (generalized)    Problem List Patient Active Problem List   Diagnosis Date Noted  . Proximal humerus fracture 11/18/2018  . Controlled substance agreement signed 04/27/2017  . Other insomnia 10/23/2016  . Anxiety disorder 10/08/2015  . Carotid artery stenosis, asymptomatic 10/08/2015  . Dyshydrosis 10/08/2015  . Essential hypertension 10/08/2015  . Hyperlipidemia LDL goal <100 10/08/2015  . Obesity (BMI 30-39.9) 10/08/2015  . Screening for prostate cancer 10/08/2015    Decari Duggar 07/14/2019, 2:17 PM  [facility] [address] [place], [place], [address] Phone: [phone]   Fax:  [phone]  Name: Aileen PilotStephen Dingee MRN: [id number] Date of Birth: [birth year]

## 2019-07-19 ENCOUNTER — Ambulatory Visit: Payer: BC Managed Care – PPO | Admitting: Occupational Therapy

## 2019-07-19 ENCOUNTER — Ambulatory Visit: Payer: BC Managed Care – PPO | Admitting: Physical Therapy

## 2019-07-21 ENCOUNTER — Encounter: Payer: BC Managed Care – PPO | Admitting: Occupational Therapy

## 2019-07-21 ENCOUNTER — Ambulatory Visit: Payer: BC Managed Care – PPO | Admitting: Physical Therapy

## 2019-07-26 ENCOUNTER — Ambulatory Visit: Payer: BC Managed Care – PPO | Admitting: Physical Therapy

## 2019-08-02 ENCOUNTER — Ambulatory Visit: Payer: BC Managed Care – PPO | Admitting: Physical Therapy

## 2019-08-02 ENCOUNTER — Ambulatory Visit: Payer: BC Managed Care – PPO | Admitting: Occupational Therapy

## 2019-08-04 ENCOUNTER — Ambulatory Visit: Payer: BC Managed Care – PPO | Admitting: Physical Therapy

## 2019-08-04 ENCOUNTER — Ambulatory Visit: Payer: BC Managed Care – PPO | Admitting: Occupational Therapy

## 2019-08-09 ENCOUNTER — Telehealth: Payer: Self-pay

## 2019-08-09 ENCOUNTER — Ambulatory Visit: Payer: BC Managed Care – PPO | Admitting: Occupational Therapy

## 2019-08-09 ENCOUNTER — Ambulatory Visit: Payer: BC Managed Care – PPO | Admitting: Physical Therapy

## 2019-08-09 NOTE — Telephone Encounter (Signed)
I called pt, r/s his 08/18/19 appt since Dr. Rexene Alberts will be out of the office to 08/17/19 at 9:30am, check-in at 9:00am. Pt verbalized understanding of new appt date and time.

## 2019-08-11 ENCOUNTER — Ambulatory Visit: Payer: BC Managed Care – PPO | Admitting: Physical Therapy

## 2019-08-11 ENCOUNTER — Ambulatory Visit: Payer: BC Managed Care – PPO | Admitting: Occupational Therapy

## 2019-08-17 ENCOUNTER — Ambulatory Visit: Payer: BC Managed Care – PPO | Admitting: Neurology

## 2019-08-18 ENCOUNTER — Other Ambulatory Visit: Payer: Self-pay

## 2019-08-18 ENCOUNTER — Encounter: Payer: Self-pay | Admitting: Podiatry

## 2019-08-18 ENCOUNTER — Ambulatory Visit: Payer: BC Managed Care – PPO | Admitting: Neurology

## 2019-08-18 ENCOUNTER — Ambulatory Visit (INDEPENDENT_AMBULATORY_CARE_PROVIDER_SITE_OTHER): Payer: BC Managed Care – PPO

## 2019-08-18 ENCOUNTER — Ambulatory Visit (INDEPENDENT_AMBULATORY_CARE_PROVIDER_SITE_OTHER): Payer: BC Managed Care – PPO | Admitting: Podiatry

## 2019-08-18 DIAGNOSIS — M2042 Other hammer toe(s) (acquired), left foot: Secondary | ICD-10-CM

## 2019-08-18 DIAGNOSIS — M2041 Other hammer toe(s) (acquired), right foot: Secondary | ICD-10-CM

## 2019-08-18 DIAGNOSIS — M79675 Pain in left toe(s): Secondary | ICD-10-CM

## 2019-08-18 DIAGNOSIS — M79674 Pain in right toe(s): Secondary | ICD-10-CM

## 2019-08-18 DIAGNOSIS — B351 Tinea unguium: Secondary | ICD-10-CM | POA: Diagnosis not present

## 2019-08-18 NOTE — Progress Notes (Signed)
Subjective:   Patient ID: Brady [name], male   DOB: 58 y.o.   MRN: [id number]   HPI Patient states he still cannot wiggle his toes and he still feels like they are swollen and he is also concerned about his Parkinson syndrome and he is going to see another neurologist today and he feels unsteady   ROS      Objective:  Physical Exam  Neurovascular status unchanged with the proximal portion of the digits in good alignment with mild swelling which is consistent with the postoperative.  With some distal deformity that I was not able to work on at the same time but diminishment of discomfort plantar feet bilateral.  Thick yellow brittle nailbeds 1-5 both feet that get painful and he cannot cut     Assessment:  Healing relatively well with patient has some other comorbidities currently with swelling of his digits which should gradually improve but unfortunately will take more time with thick yellow brittle nailbeds 1-5 both feet that are painful     Plan:  H&P x-rays reviewed debridement of nails accomplished today and I explained someday he may require some distal arthroplasties if symptoms were to occur but at this point I am relatively hopeful as the swelling goes down we will continue to improve.  He is to be seen back as needed and is encouraged to call with questions concerns he may have  X-rays indicate that the fusion sites across the proximal joints look good with distal deformity which may need to be addressed at 1 point in future

## 2019-08-24 ENCOUNTER — Other Ambulatory Visit: Payer: Self-pay

## 2019-08-24 ENCOUNTER — Encounter: Payer: Self-pay | Admitting: Neurology

## 2019-08-24 ENCOUNTER — Ambulatory Visit (INDEPENDENT_AMBULATORY_CARE_PROVIDER_SITE_OTHER): Payer: BC Managed Care – PPO | Admitting: Neurology

## 2019-08-24 VITALS — BP 126/75 | HR 73 | Temp 97.4°F | Ht 69.0 in | Wt 204.0 lb

## 2019-08-24 DIAGNOSIS — G20 Parkinson's disease: Secondary | ICD-10-CM | POA: Diagnosis not present

## 2019-08-24 NOTE — Progress Notes (Signed)
Subjective:    Patient ID: Brady [name] is a 58 y.o. male.  HPI     Interim history:   Mr. Brady [name] is a 58 year old left-handed gentleman with an underlying medical history of hypertension, hyperlipidemia, anxiety, carotid artery stenosis, and obesity, he had an presents for follow-up consultation of his parkinsonism.  The patient is unaccompanied today.  I last saw him on 05/19/2019, at which time he had difficulty walking because of recent bilateral hammertoe surgeries.  We talked about his DaTscan results.  He had significant flareup of his anxiety and depression.  He was on Zoloft and BuSpar.  He was on Xanax as needed.  He was advised to start Sinemet with gradual titration.  He called in the interim reporting that he felt imbalanced.  He was advised that balance issues could be secondary to parkinsonism and he was encouraged to continue with the Sinemet if possible.  Today, 08/24/2019: He reports that he still has foot pain and swelling in the toes bilaterally.  He had an appointment with his foot doctor last week.  He was told that healing could take up to 6 months from the surgery.  He also had an appointment with Dr. Boyd Kerbs at [facility] neurology for a second opinion.  We will try to get records from the office visit.  The patient is scheduled to have EMG and nerve conduction testing next week.  He was also advised that he could stop the Sinemet about a week prior to his next appointment.  He will be stopping the Sinemet after today.  He still has balance problems and feels like he could fall easily.  He has not been using his walker any longer and uses a cane on the left side.  He has started working some this month.  He has his constipation under better control secondary to taking a stool softener on a daily basis, twice daily.  He tries to hydrate well.  He feels that his balance makes him feel like he could topple over forward or also backwards but thankfully has not had any  recent falls.  He had a recent brain MRI and I was able to review the results in care everywhere.  He had a brain MRI without contrast through [facility] on 08/23/2019 and I reviewed the results: IMPRESSION:  Moderate generalized cerebral atrophy increased for age otherwise unremarkable brain MRI.  The patient's allergies, current medications, family history, past medical history, past social history, past surgical history and problem list were reviewed and updated as appropriate.    Previously:     I first met him on 04/06/2019 at the request of his primary care physician, at which time he reported problems with his gait and balance starting in 2018.  He had a fall in December 2018 with injury.  He had more than one fall.  He reported a family history in his paternal aunt of Parkinson's disease.  I suggested we proceed with a DaTscan, his history and examination were concerning for parkinsonism.    He had a DaTscan in the interim on 05/05/2019 and I reviewed the results: IMPRESSION: Loss of radiotracer activity within the posterior striata and asymmetric decreased activity in the head of the LEFT caudate nucleus is a pattern typical of Parkinson's syndrome pathology.   We called him with his test results.   Of note, he was in the emergency room in the interim on 04/16/2019 for fecal impaction.    04/06/2019: (He) reports problems with his balance  that started in 2018.  He had a fall in December and injured his right scapula at the time.  He was having injections into his lower back secondary to right-sided sciatica.  He had noticed over time changes in his gait.  His wife noticed changes in his posture and gait in 2018.  In March 2020 he fell and injured his left scapula and had a fractured right humerus which needed surgery.  He has overtime noticed shorter steps, changes in his handwriting, lack of energy.  He does not sleep very well at night.  He reports that his father passed away in 29-Jul-2018 with dementia, he lived to be 59 years old.  His paternal aunt reportedly had Parkinson's disease.   He lives with his wife, they have no children.  They have no pets in the house.  He works as a Scientist, physiological. I reviewed your office note from 03/09/2019.  He has noted a tremor in his left hand and leg since about June 2020.  He has noticed it intermittently, he has noticed shuffling of his gait and stutter steps.  His anxiety had increased.  Of note, he is on alprazolam 0.25 mg twice daily as needed, he is also on BuSpar 10 mg twice daily, Cymbalta 60 mg daily.  He had a recent thyroid function test in your office.  We will request test results from your office. He had a cervical spine CT without contrast as well as head CT without contrast on 11/16/2018 with indication of fall and I reviewed the results: IMPRESSION: No acute intracranial abnormality. No acute bony abnormality in the cervical spine. He is a non-smoker, he does not currently drink any alcohol, drinks caffeine in the form of soda, about 16 ounce every other day or so.  He has had constipation, in fact, in October 2019 he had to go to the emergency room with obstipation, requiring disimpaction.  His Past Medical History Is Significant For: Past Medical History:  Diagnosis Date  . Anxiety   . Carotid artery stenosis   . Dyshydrosis   . Hyperlipidemia   . Hypertension     His Past Surgical History Is Significant For: Past Surgical History:  Procedure Laterality Date  . ORIF HUMERUS FRACTURE Right 11/18/2018   Procedure: OPEN REDUCTION INTERNAL FIXATION (ORIF) PROXIMAL HUMERUS FRACTURE;  Surgeon: Justice Britain, MD;  Location: [facility];  Service: Orthopedics;  Laterality: Right;    His Family History Is Significant For: History reviewed. No pertinent family history.  His Social History Is Significant For: Social History   Socioeconomic History  . Marital status: Married    Spouse name: Not on file  . Number of children:  Not on file  . Years of education: Not on file  . Highest education level: Not on file  Occupational History  . Not on file  Tobacco Use  . Smoking status: Never Smoker  . Smokeless tobacco: Never Used  Substance and Sexual Activity  . Alcohol use: Not Currently  . Drug use: Not Currently  . Sexual activity: Not on file  Other Topics Concern  . Not on file  Social History Narrative  . Not on file   Social Determinants of Health   Financial Resource Strain:   . Difficulty of Paying Living Expenses: Not on file  Food Insecurity:   . Worried About Charity fundraiser in the Last Year: Not on file  . Ran Out of Food in the Last Year: Not on file  Transportation Needs:   . Film/video editor (Medical): Not on file  . Lack of Transportation (Non-Medical): Not on file  Physical Activity:   . Days of Exercise per Week: Not on file  . Minutes of Exercise per Session: Not on file  Stress:   . Feeling of Stress : Not on file  Social Connections:   . Frequency of Communication with Friends and Family: Not on file  . Frequency of Social Gatherings with Friends and Family: Not on file  . Attends Religious Services: Not on file  . Active Member of Clubs or Organizations: Not on file  . Attends Archivist Meetings: Not on file  . Marital Status: Not on file    His Allergies Are:  No Known Allergies:   His Current Medications Are:  Outpatient Encounter Medications as of 08/24/2019  Medication Sig  . ALPRAZolam (XANAX) 0.25 MG tablet Take 0.25 mg by mouth at bedtime as needed for anxiety.  Marland Kitchen aspirin 81 MG chewable tablet Chew 81 mg by mouth daily.   . carbidopa-levodopa (SINEMET IR) 25-100 MG tablet Take 1/2 pill twice daily x 1 week, then 1/2 pill 3 times a day x 1 week, then 1 pill 3 times a day thereafter.  . docusate sodium (COLACE) 100 MG capsule Take 1 capsule (100 mg total) by mouth every 12 (twelve) hours.  Marland Kitchen lisinopril (PRINIVIL,ZESTRIL) 20 MG tablet Take 20 mg  by mouth daily.   Marland Kitchen lovastatin (MEVACOR) 20 MG tablet Take 20 mg by mouth daily.   . Multiple Vitamin (MULTI-VITAMINS) TABS Take 1 tablet by mouth daily.    No facility-administered encounter medications on file as of 08/24/2019.  :  Review of Systems:  Out of a complete 14 point review of systems, all are reviewed and negative with the exception of these symptoms as listed below: Review of Systems  Neurological:       Pt sts he has not been doing well since his last visit. Pt sts he had a second opinion at [facility].It was recommended for him to stop his sinemet. MRI was repeated and he has been scheduled for EMG for January 2021.  Sts since Aug, his energy has decreased, trouble getting up from standing. He also sts dressing himself. He reports balance has been extremely off since last visit. Denies any recent falls. Last one was in May of 2020.      Objective:  Neurological Exam  Physical Exam Physical Examination:   Vitals:   08/24/19 1402  BP: 126/75  Pulse: 73  Temp: (!) 97.4 F (36.3 C)  SpO2: 98%    General Examination: The patient is a very pleasant 58 y.o. male in no acute distress. He appears well-developed and well-nourished and well groomed. Less anxious appearing today.  HEENT:Normocephalic, atraumatic, pupils are equal, round and reactive to light. He wears corrective eyeglasses. Extraocular tracking shows mild saccadic breakdown, he has slight limitation to upgaze and downgaze. He has no nystagmus. He has moderate facial masking. He has no obvious sialorrhea. Hearing is grossly intact. He has moderate nuchal rigidity. He has no lip, neck or jaw tremor. He has mild hypophonia, no obvious dysarthria, airway examination with no dryness. Tongue protrudes centrally in palate elevates symmetrically.  Chest:is clear to auscultation without wheezing, rhonchi or crackles noted.  Heart:sounds are regular and normal without murmurs, rubs or gallops noted.    Abdomen:is soft, non-tender and non-distended with normal bowel sounds appreciated on auscultation.  Extremities:There isnopitting edema in the  distal lower extremities bilaterally.   Skin: is warm and dry with no trophic changes noted.   Musculoskeletal: exam revealsDecreased range of motion in both shoulders.  Neurologically:  Mental status: The patient is awake and alert, payinggoodattention. Heis able toprovide the history. There is amilddegree of bradyphrenia. Speech is mildlyhypophonic with nodysarthria noted. Mood is congruent and affect mildly anxious. (On8/12/2018:On Archimedes spiral drawing he has no significant trembling with the left hand which is his dominant hand, slight insecurity with the right hand, handwriting with the left hand is legible, slightly tremulous, not particularly micrographic.)  Cranial nerves are as described above under HEENT exam. In addition, shoulder shrug is normal with equal shoulder height noted.  Motor exam: Normal bulk, and strength for age is noted.  Tone ismoderatelyrigid with presenceof cogwheeling in the Upper extremities bilaterally.He has moderate bradykinesia. He has an intermittent resting tremor in the LLE today. He has no significant postural or action tremor. He has no intention tremor. Fine motor skills are impaired in the moderate range in the upper and lower extremities bilaterally, no telltale lateralization noted.Difficult to assess foot agility is moderately impaired.   Cerebellar testing shows no dysmetria or intention tremor.   Sensory exam is intact to light touch.   Gait, station and balance:IHe stands with mild difficulty, pushes himself up, requires no more than 1 attempt.  Requires no assistance.  Did not bring a walker.  He uses a single-point cane on the left, has decreased arm swing on the right, decreased stride length and pace, slight start hesitation but no freezing, mild difficulty  with turns, balance is mildly impaired.   Assessmentand Plan:   In summary,Janai Kanelakosis a very pleasant 52 year oldmalewith an underlying medical history of hypertension, hyperlipidemia, anxiety, carotid artery stenosis, Obesity, sciatica,, hammertoes with status post surgeries to both feet in August 2020, status post lower back injection, Status post ORIF on the right for proximal humerus fracture on 11/18/2018, who presents for follow-up consultation of his parkinsonism.  He has had symptoms since 2018, he has had falls.  He has developed intermittent tremors, fine motor difficulties, balance issues, gait disorder. His history and examination are concerning for parkinsonism,Idiopathic Parkinson's disease versus atypical parkinsonism.  He has had significant constipation and obstipation.  His DaTscan on 05/05/19 showed loss of radiotracer activity within the posterior striata and asymmetric decreased activity in the head of the LEFT caudate nucleus. He had an interim second opinion appointment with [facility] neurology, we will request test results and also office notes.  He had an interim brain MRI on 08/23/2019 through [facility] which showed moderate atrophy.  He has been on Sinemet since September. He called in October reporting that his balance was worse.  He felt that it was secondary to the medication.  It is certainly possible that there is a correlation.  In particular, if he has atypical parkinsonism, he may not respond favorably to levodopa therapy.  Dr. Trula Ore Has advised him to stop the Sinemet as of tomorrow.  Patient is advised that he could do a short taper by taking only 2 pills today and 1 last 1 in the morning.  He is encouraged to keep his follow-up appointment at [facility] neurology next week.  As I understand he will have lower extremity EMG and nerve conduction testing.  Unfortunately, he has been having residual problems with both feet after his hammertoe surgeries in August.  He  has residual pain and swelling.  Pins have come out thankfully.  He is  advised to use his walker for longer distances or when needed.  He is Reminded to be proactive about constipation issues, thankfully constipation is better currently.  He is advised to change positions slowly and stay well-hydrated, stay well rested.  I plan to see him back in 3 months, sooner if needed.  I answered all his questions today and he was in agreement. I spent 25 minutes in total face-to-face time with the patient, more than 50% of which was spent in counseling and coordination of care, reviewing test results, reviewing medication and discussing or reviewing the diagnosis of Parkinsonism, its prognosis and treatment options. Pertinent laboratory and imaging test results that were available during this visit with the patient were reviewed by me and considered in my medical decision making (see chart for details).

## 2019-08-24 NOTE — Patient Instructions (Signed)
I am okay with you tapering off the Sinemet as instructed by Dr. Trula Ore.  You could take 1 more pill today and 1 in the morning and then stop and keep your appointment on 09/01/2019 as scheduled with Dr. Trula Ore.  Keep me posted as to your appointment with him.  We will also try to get records from your visit last week.  I would be happy to consider another medication with you in the near future for your parkinsonism. If he has a suggestion for you next week you are welcome to start that medication. Please use your walker for gait safety, fall risk prevention is key.  Also keep your appointment with your podiatrist.  Try to stay well-hydrated, change positions slowly, try to rest enough at night.  Continue to stay proactive about constipation issues. Please follow-up with me in 3 months. If you decide to follow-up with Dr. Trula Ore from now on it is okay as well.

## 2019-08-28 ENCOUNTER — Telehealth: Payer: Self-pay | Admitting: Neurology

## 2019-08-28 NOTE — Telephone Encounter (Signed)
I called the patient.  The patient has had some problems with worsening balance, he was on Sinemet taking the 25/100 mg tablets 3 times daily.  He stopped the medication as per instruction through his doctors 4 days ago, he went off the medication cold [place].  He is feeling jittery, his balance is worsened off the medication.  I have indicated that he is to start back taking 1/2 tablet 3 times daily.  He is not sure that the Sinemet has helped him at all, but he feels worse off the medication.  If he is having significant balance issues and falls early in the process, he may have something other than Parkinson's disease such as PSP.

## 2019-09-27 ENCOUNTER — Encounter: Payer: Self-pay | Admitting: Physical Therapy

## 2019-09-27 NOTE — Therapy (Signed)
[facility] [address] [place], [place], [address] Phone: [phone]   Fax:  [phone]  Patient Details  Name: Brady [name] MRN: [id number] Date of Birth: [birth year] Referring Provider:  No ref. provider found  Encounter Date: 09/27/2019  PHYSICAL THERAPY DISCHARGE SUMMARY  Visits from Start of Care: 7 (last visit was 07/14/2019)  Current functional level related to goals / functional outcomes: PT Short Term Goals - 06/28/19 1044      PT SHORT TERM GOAL #1   Title  Perform FGA or Mini BESTest and write goals as indicated. TARGET DATE FOR ALL STGS: 07/01/19 (Improve Berg score by at least 5 points for decr. fall risk)    Baseline  Berg 06/14/2019:  35/56; 48/56 06/28/2019    Time  4    Period  Weeks    Status  Achieved      PT SHORT TERM GOAL #2   Title  Pt will be perform HEP with S from wife to improve strength, balance, and gait.    Time  4    Period  Weeks    Status  Partially Met      PT SHORT TERM GOAL #3   Title  Pt will perform normal TUG with LRAD in </=15 sec. to decr. falls risk.    Time  4    Status  Partially Met      PT SHORT TERM GOAL #4   Title  Pt will improve gait speed with LRAD to >/=2.68f/sec. to safely amb. in the community.    Time  4    Period  Weeks    Status  Partially Met      PT SHORT TERM GOAL #5   Title  Pt will amb. 500' with LRAD at MOD I level, over even terrain to improve functional mobility.    Time  4    Period  Weeks    Status  On-going      Pt met 1 of 5 STGs.    PT Long Term Goals - 06/03/19 1010      PT LONG TERM GOAL #1   Title  Pt will improve cognitive TUG time with LRAD to </=15 sec. to decr. falls risk. TARGET DATE FOR ALL LTGS: 07/29/2019    Time  8    Period  Weeks    Status  On-going      PT LONG TERM GOAL #2   Title  Pt will amb. 1000' over paved surfaces with LRAD at MOD I level    Time  8    Period  Weeks    Status  On-going      PT  LONG TERM GOAL #3   Title  Pt will be IND in progressed HEP to improve strength, balance, and gait.    Time  8    Period  Weeks    Status  On-going      PT LONG TERM GOAL #4   Title  Pt will report no falls over last four weeks to improve safety.    Time  8    Period  Weeks    Status  On-going      PT LONG TERM GOAL #5   Title  Pt will perform 8 of 10 reps of sit<>stand with proper technique, minimal UE use, no LOB, for improved transfer safety and efficiency.    Time  8    Period  Weeks    Status  New  LTGs not able to be assessed, as pt cancelled multiple appointments, including remaining visits.   Remaining deficits: Balance, strength, transfers, gait; decreased safety awareness   Education / Equipment: Educated in HEP, use of appropriate assistive device.  Plan: Patient agrees to discharge.  Patient goals were not met. Patient is being discharged due to the patient's request.  ?????Pt requested d/c from therapies due to returning to work.       Paxton Binns W. 09/27/2019, 2:23 PM Brittnay Pigman Gerrit Friends, PT 09/27/19 2:26 PM Phone: [phone] Fax: [facility] [address] [place], [place], [address] Phone: [phone]   Fax:  [phone]

## 2019-11-23 ENCOUNTER — Ambulatory Visit: Payer: BC Managed Care – PPO | Admitting: Neurology

## 2020-08-10 IMAGING — DX CHEST  1 VIEW
1 series · 1 of 1 positions shown · non-contrast
Comparison: None.

CLINICAL DATA: Fall down steps.

EXAM:
CHEST  1 VIEW

[chest pa]
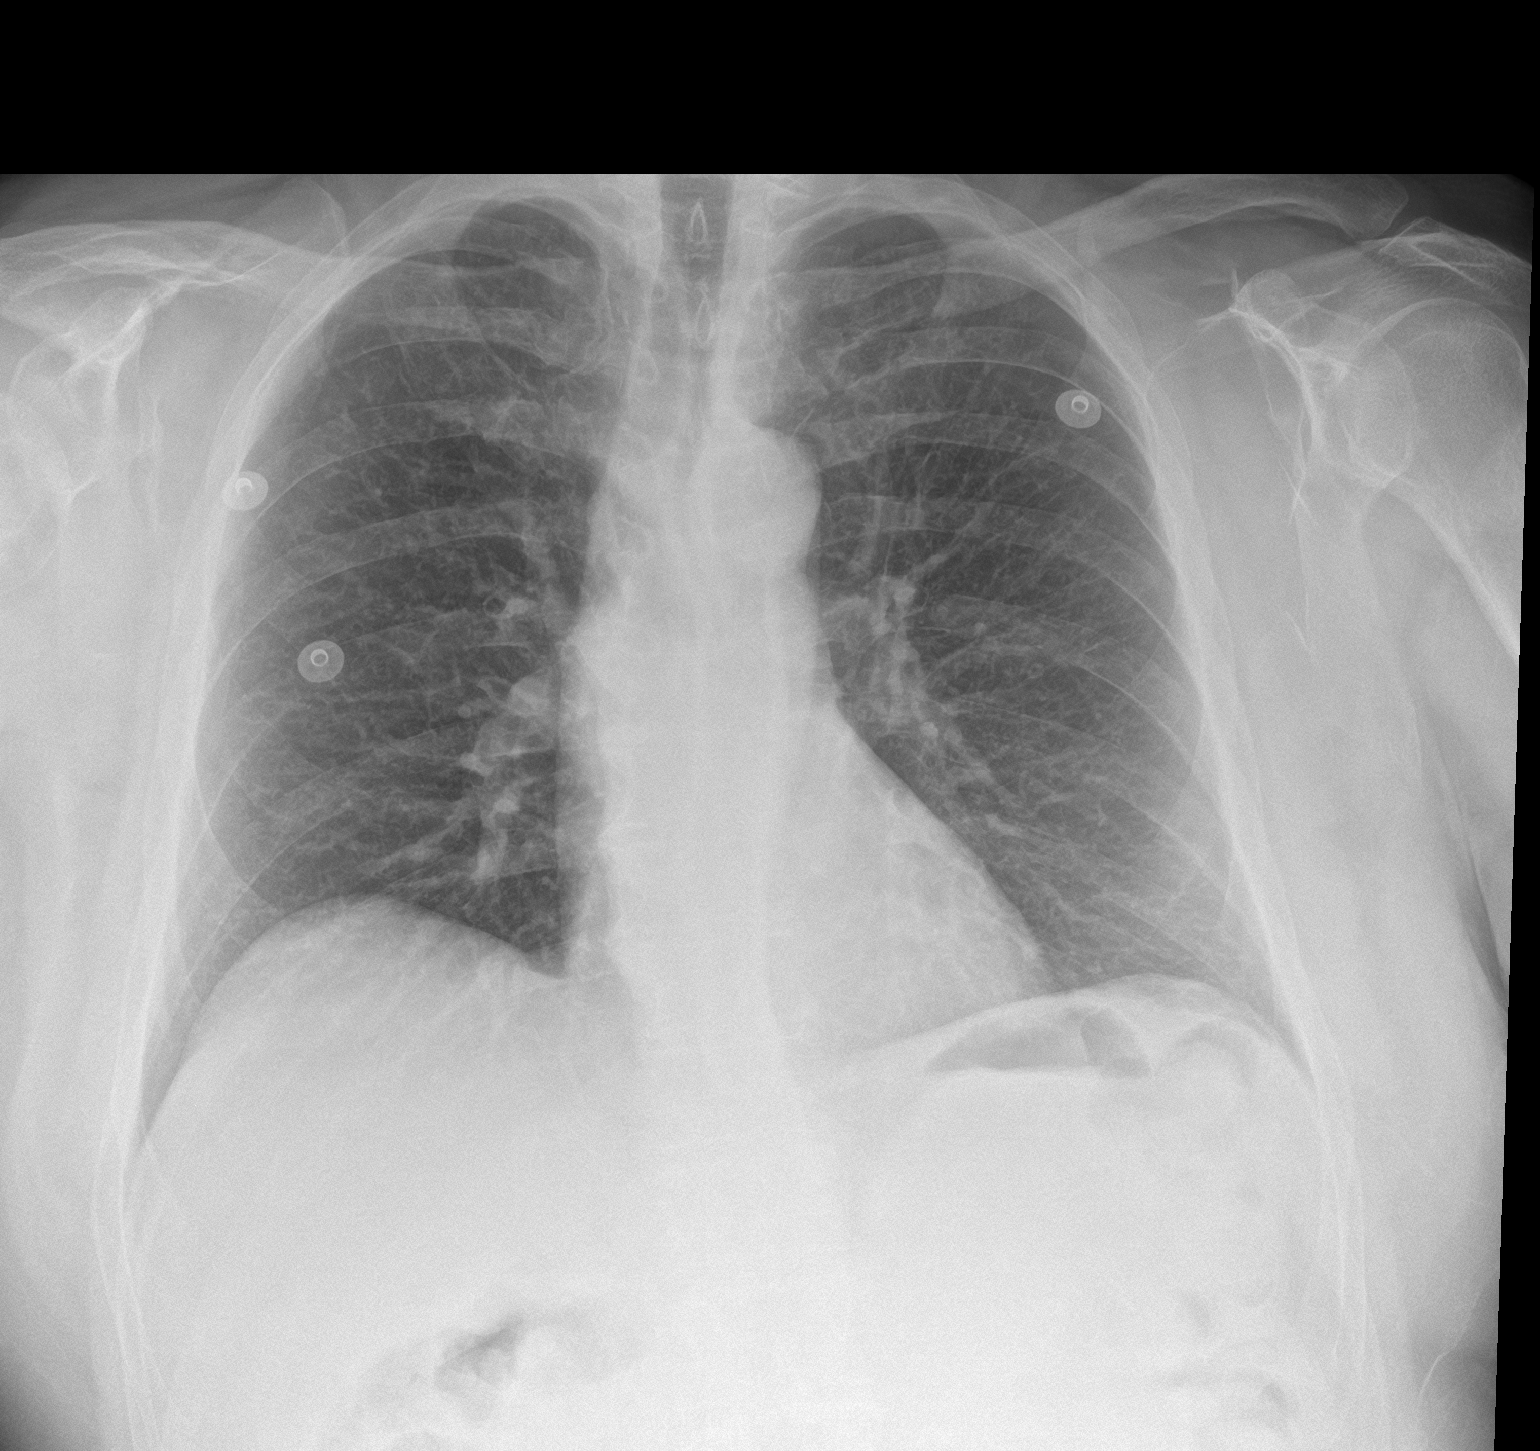

[1 of 1 positions shown; findings below may reference images not displayed]

FINDINGS: Heart and mediastinal contours are within normal limits. No focal
opacities or effusions. No acute bony abnormality.
IMPRESSION: No active disease.

## 2020-08-13 IMAGING — RF RIGHT SHOULDER - 1 VIEW
1 series · 3 of 3 positions shown · non-contrast
Comparison: Radiography from 3 days ago

CLINICAL DATA: Right shoulder ORIF

EXAM:
DG C-ARM 61-120 MIN; RIGHT SHOULDER - 1 VIEW

[Series 1: unknown protocol · 0.14mm/px · 3 of 3 slices shown]
[im 1/3]
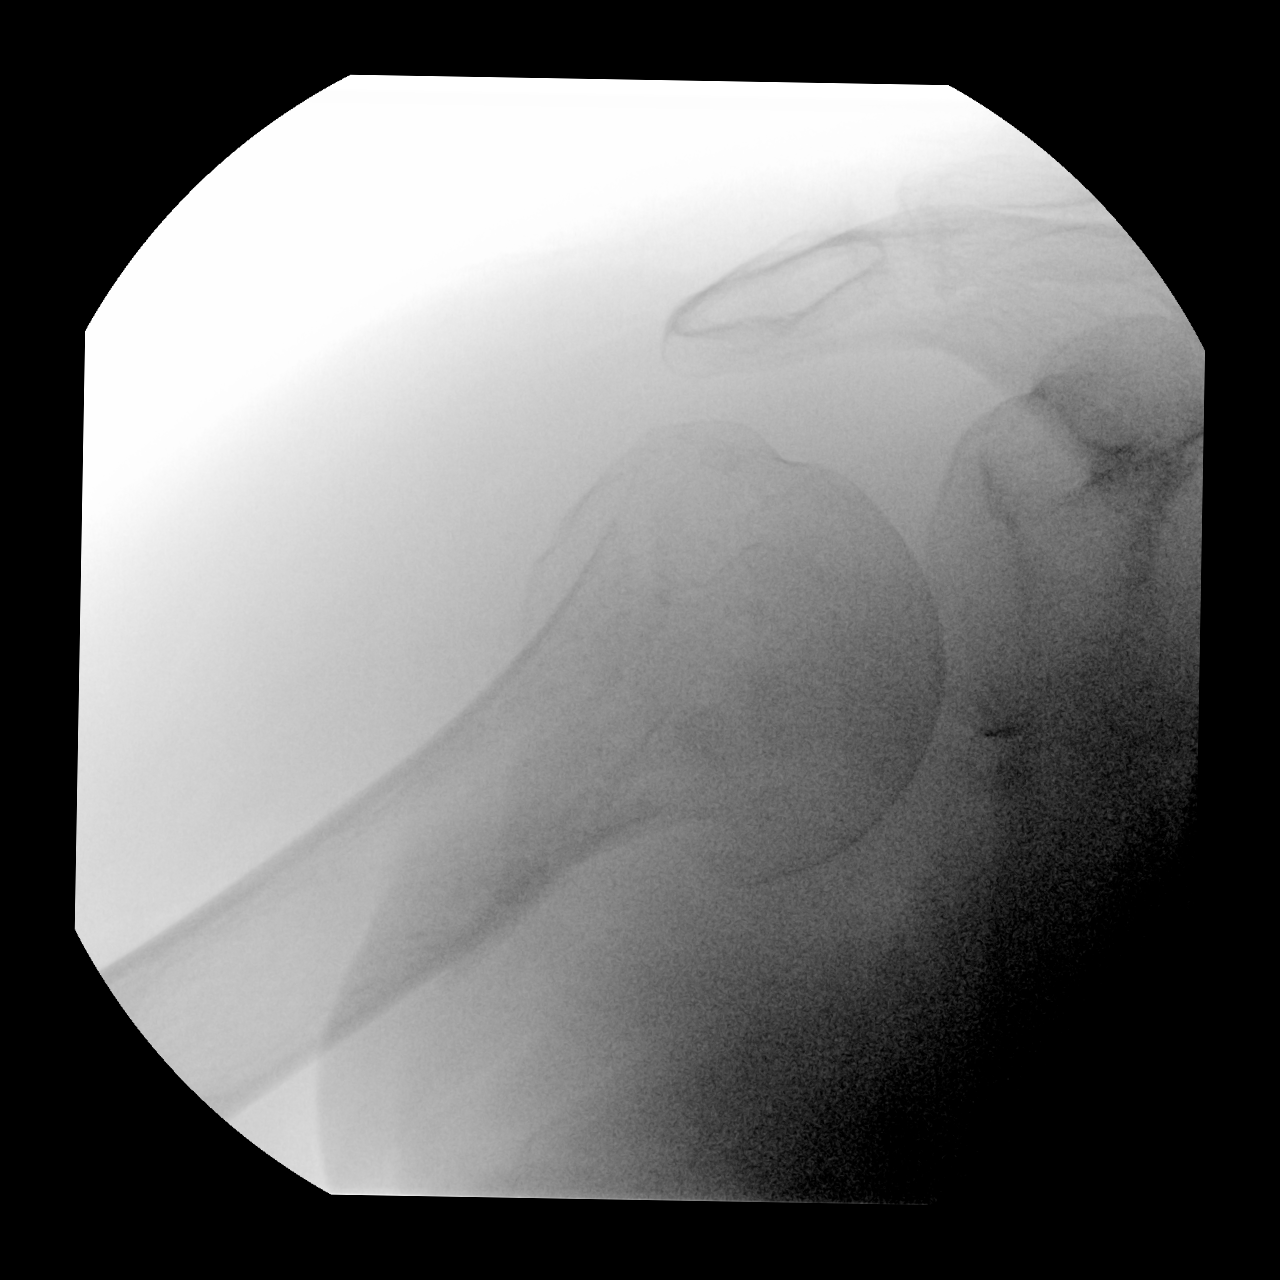
[im 2/3]
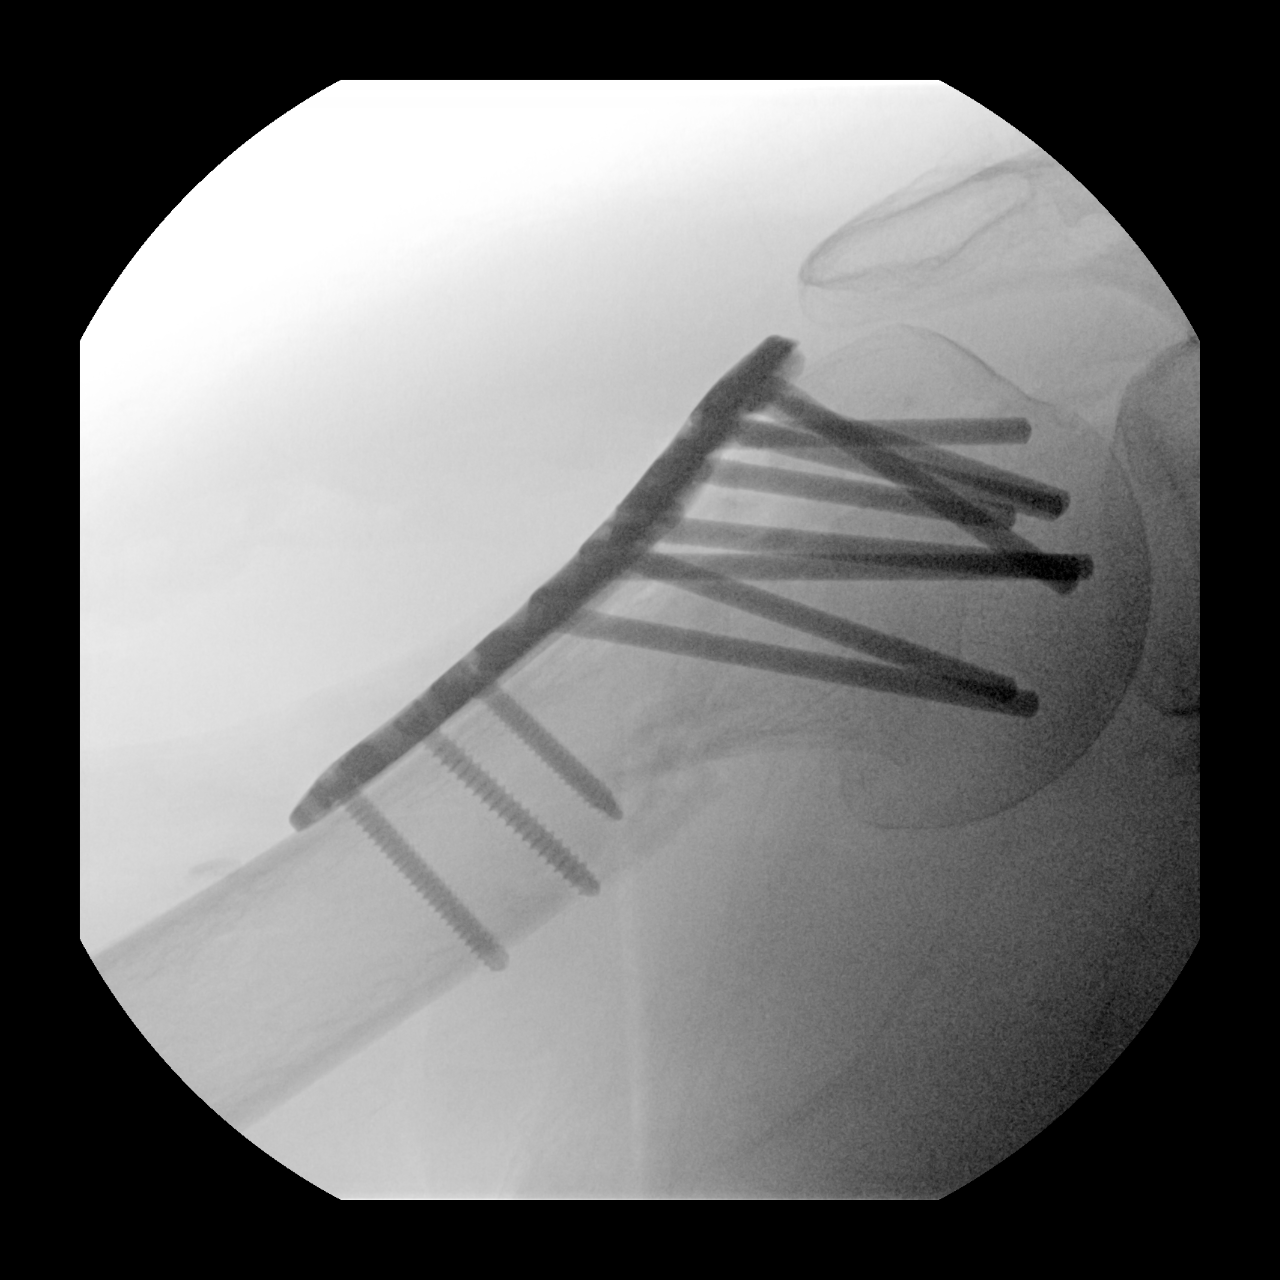
[im 3/3]
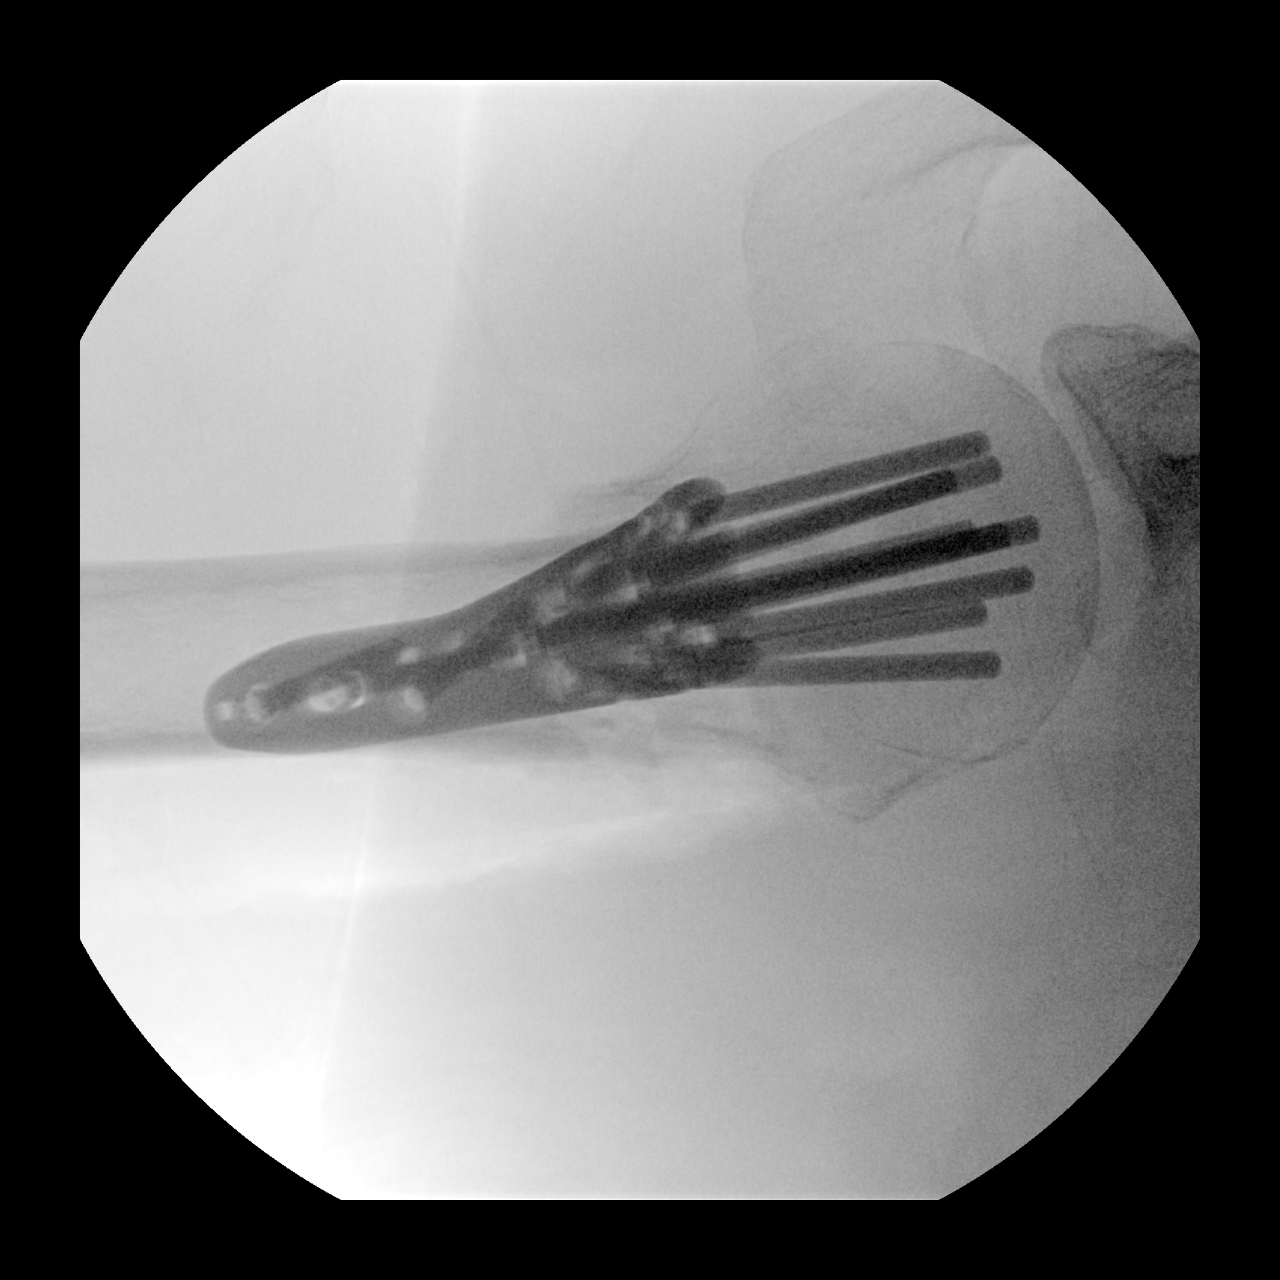

[3 of 3 positions shown; findings below may reference images not displayed]

FINDINGS: Three intraprocedural fluoroscopic images show plating of a humeral
neck fracture. No screw penetration into the joint space which is
located.
IMPRESSION: Fluoroscopy for proximal humerus ORIF.

## 2021-01-10 IMAGING — DX DG ABDOMEN ACUTE W/ 1V CHEST
3 series · 3 of 3 positions shown · non-contrast
Comparison: Chest CTA 11/16/2018

CLINICAL DATA: Constipation for 5 days.

EXAM:
DG ABDOMEN ACUTE W/ 1V CHEST

[chest pa]
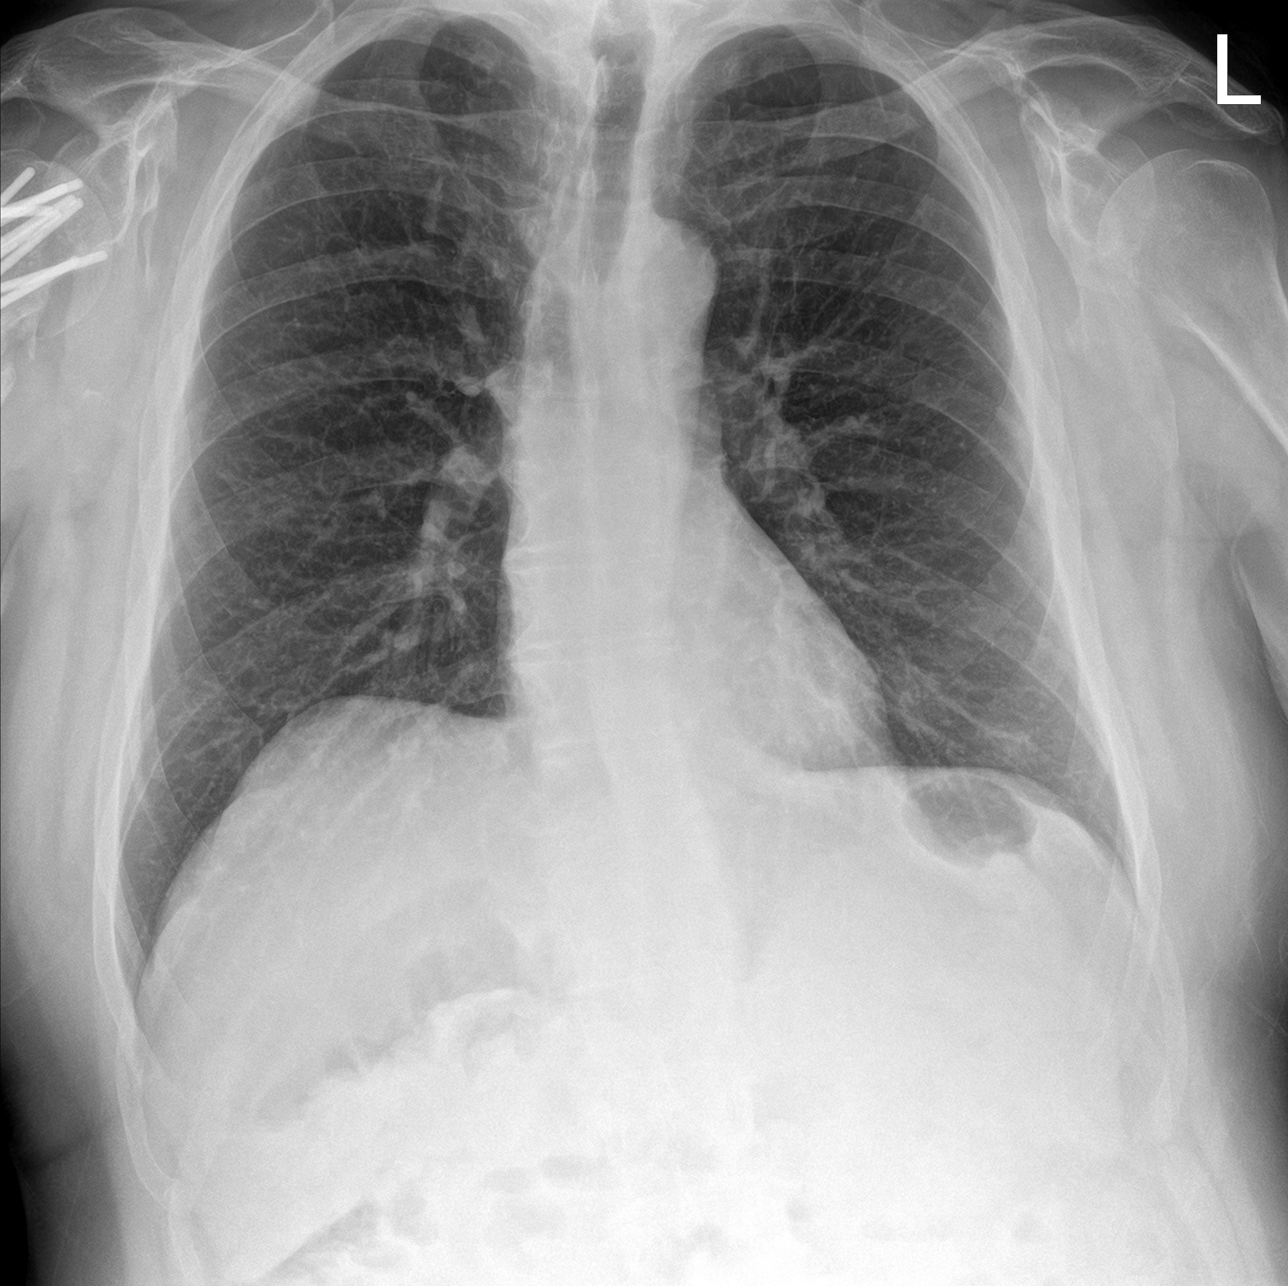

[abdomen erect]
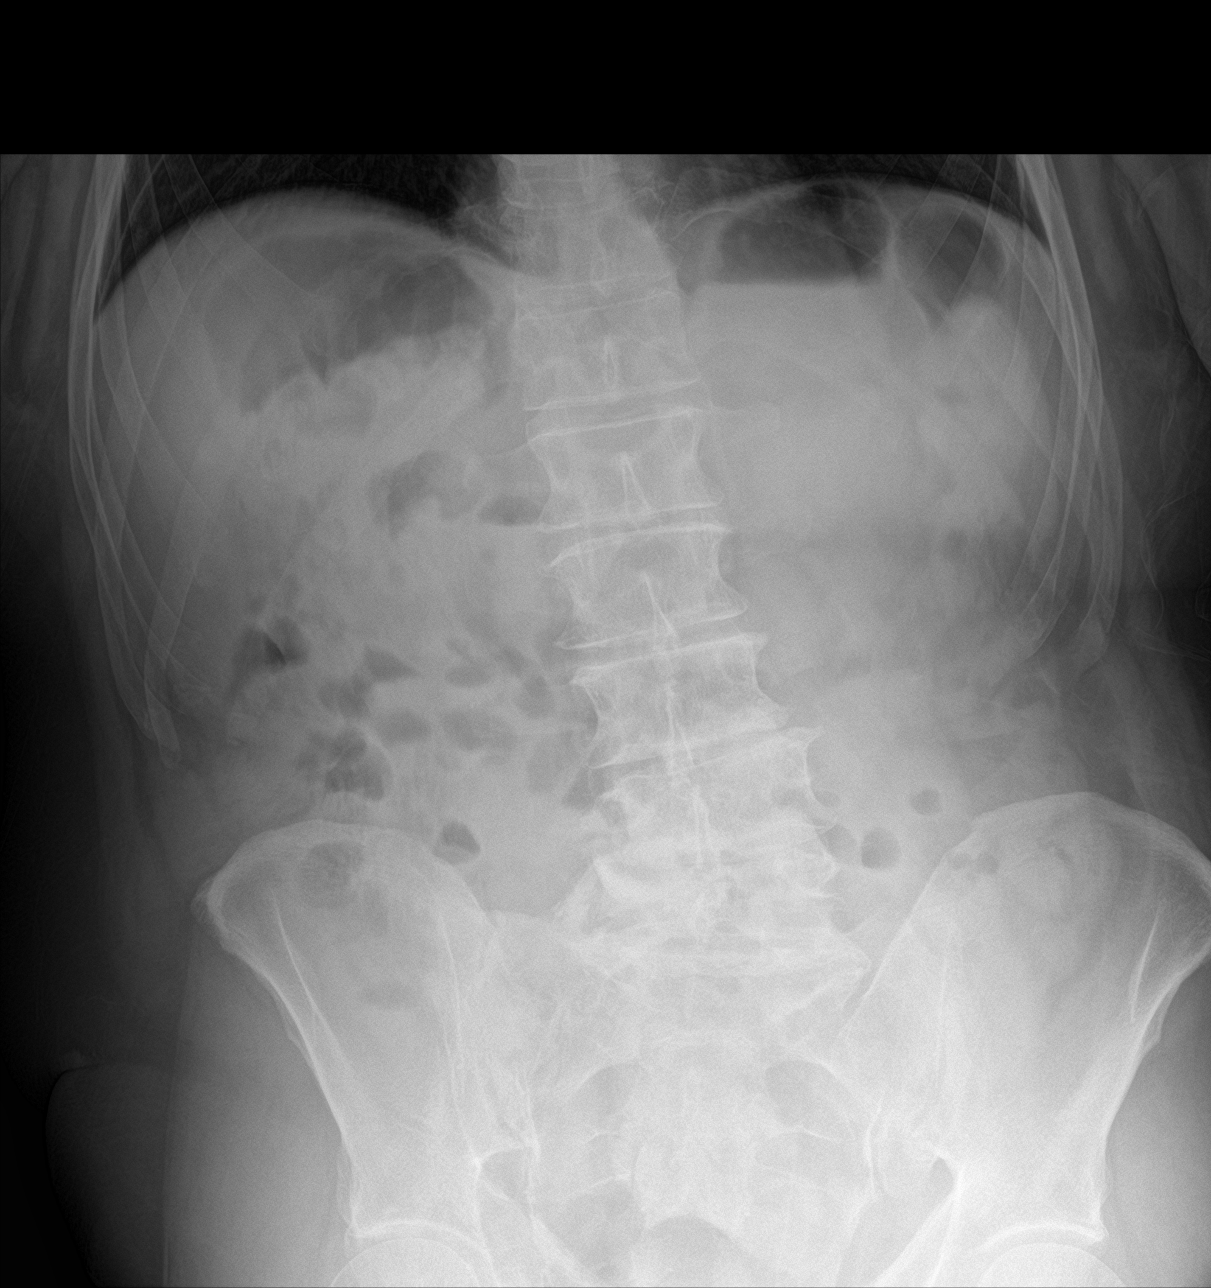

[abdomen supine]
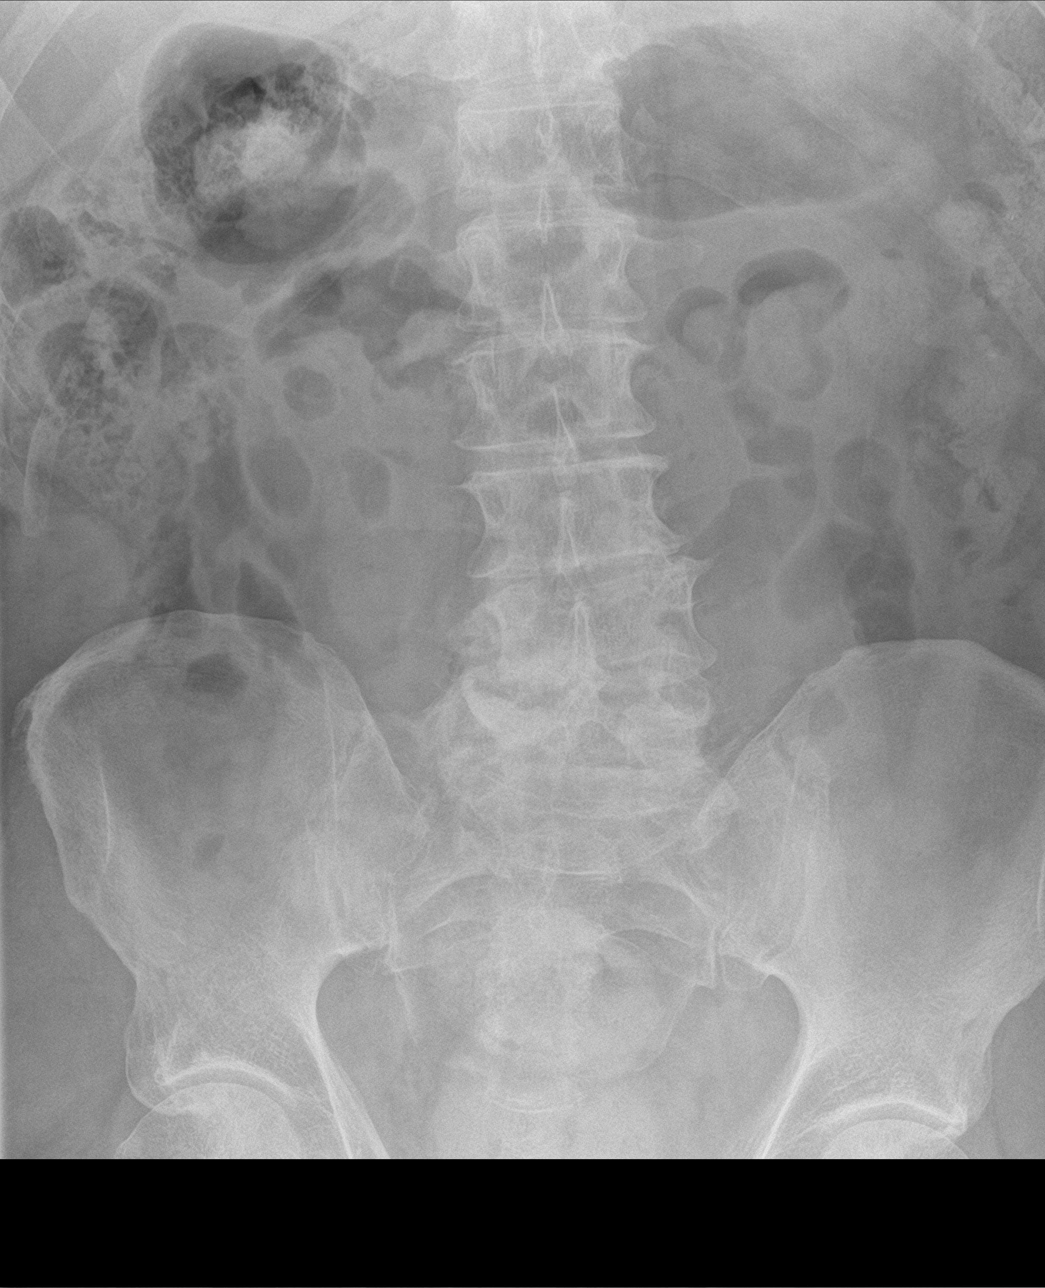

[3 of 3 positions shown; findings below may reference images not displayed]

FINDINGS: The cardiomediastinal contours are normal. The lungs are clear.
There is no free intra-abdominal air. Air-fluid level in the
stomach. Moderate stool in the hepatic flexure, transverse colon,
and splenic flexure. Moderate rectal stool, rectal distention of
cm. No acute osseous abnormalities are seen. Surgical hardware in
the right proximal humerus partially included.
IMPRESSION: Moderate colonic stool burden. Stool distends the rectum with rectal
distention of 6.6 cm. No bowel obstruction or free air.

## 2023-05-28 ENCOUNTER — Other Ambulatory Visit (HOSPITAL_COMMUNITY): Payer: Self-pay | Admitting: Nephrology

## 2023-05-28 DIAGNOSIS — R809 Proteinuria, unspecified: Secondary | ICD-10-CM

## 2023-05-28 DIAGNOSIS — I10 Essential (primary) hypertension: Secondary | ICD-10-CM

## 2023-06-10 ENCOUNTER — Ambulatory Visit
Admission: RE | Admit: 2023-06-10 | Discharge: 2023-06-10 | Disposition: A | Discharge status: Home / Self Care | Payer: BC Managed Care – PPO | Source: Ambulatory Visit | Attending: Nephrology | Admitting: Nephrology

## 2023-06-10 DIAGNOSIS — R809 Proteinuria, unspecified: Secondary | ICD-10-CM

## 2023-06-10 DIAGNOSIS — I10 Essential (primary) hypertension: Secondary | ICD-10-CM

## 2023-06-10 MED ORDER — IOPAMIDOL (ISOVUE-300) INJECTION 61%
100.0000 mL | Freq: Once | INTRAVENOUS | Status: AC | PRN
  Administered 2023-06-10: 100 mL via INTRAVENOUS

## 2023-08-03 ENCOUNTER — Other Ambulatory Visit: Payer: Self-pay | Admitting: Family Medicine

## 2023-08-03 ENCOUNTER — Inpatient Hospital Stay
Admission: RE | Admit: 2023-08-03 | Discharge: 2023-08-03 | Disposition: A | Discharge status: Home / Self Care | Payer: Self-pay | Source: Ambulatory Visit | Attending: Orthopedic Surgery | Admitting: Orthopedic Surgery

## 2023-08-03 DIAGNOSIS — Z049 Encounter for examination and observation for unspecified reason: Secondary | ICD-10-CM

## 2023-08-06 NOTE — Progress Notes (Signed)
Referring Physician:  Lamont Dowdy, MD [address] [facility] [address] [place],  [place] [address]  Primary Physician:  Loyal Jacobson, MD  History of Present Illness: 08/11/2023 Mr. Brady [name] has a history of HTN, hyperlipidemia, and Parkinson's.   He has intermittent LBP, left > right side, with no leg pain. Pain is worse when getting out of bed and when getting up after sitting. He does okay when he is up and walking. Pain also gets worse by the end of the day. He notes some weakness in both legs.   He is not able to take NSAIDs due to renal disease (protein in kidney?).   Bowel/Bladder Dysfunction: none  Conservative measures:  Physical therapy: did for mobilization/parkinson's  - did 7 visits and was discharged on 09/27/19 Multimodal medical therapy including regular antiinflammatories: none  Injections:  he's had local steroid injections by neurology, had one in last month.  He had lumbar ESIs years ago with improvement.   Past Surgery: no lumbar surgery  The symptoms are causing a significant impact on the patient's life.   Review of Systems:  A 10 point review of systems is negative, except for the pertinent positives and negatives detailed in the HPI.  Past Medical History: Past Medical History:  Diagnosis Date   Anxiety    Carotid artery stenosis    Dyshydrosis    Hyperlipidemia    Hypertension     Past Surgical History: Past Surgical History:  Procedure Laterality Date   ORIF HUMERUS FRACTURE Right 11/18/2018   Procedure: OPEN REDUCTION INTERNAL FIXATION (ORIF) PROXIMAL HUMERUS FRACTURE;  Surgeon: Francena Hanly, MD;  Location: [facility];  Service: Orthopedics;  Laterality: Right;    Allergies: Allergies as of 08/11/2023   (No Known Allergies)    Medications: Outpatient Encounter Medications as of 08/11/2023  Medication Sig   carbidopa-levodopa (SINEMET CR) 50-200 MG tablet Take 1 tablet by mouth 2 (two) times daily.   docusate sodium  (COLACE) 100 MG capsule Take 1 capsule (100 mg total) by mouth every 12 (twelve) hours.   lisinopril (ZESTRIL) 10 MG tablet Take 10 mg by mouth daily.   lovastatin (MEVACOR) 20 MG tablet Take 20 mg by mouth daily.    rOPINIRole (REQUIP XL) 8 MG 24 hr tablet Take 8 mg by mouth at bedtime.   sertraline (ZOLOFT) 100 MG tablet Take 1 tablet every day by oral route.   tiZANidine (ZANAFLEX) 4 MG tablet Take by mouth.   [DISCONTINUED] ALPRAZolam (XANAX) 0.25 MG tablet Take 0.25 mg by mouth at bedtime as needed for anxiety.   [DISCONTINUED] aspirin 81 MG chewable tablet Chew 81 mg by mouth daily.    [DISCONTINUED] carbidopa-levodopa (SINEMET IR) 25-100 MG tablet Take 1/2 pill twice daily x 1 week, then 1/2 pill 3 times a day x 1 week, then 1 pill 3 times a day thereafter.   [DISCONTINUED] lisinopril (PRINIVIL,ZESTRIL) 20 MG tablet Take 20 mg by mouth daily.    [DISCONTINUED] Multiple Vitamin (MULTI-VITAMINS) TABS Take 1 tablet by mouth daily.    No facility-administered encounter medications on file as of 08/11/2023.    Social History: Social History   Tobacco Use   Smoking status: Never   Smokeless tobacco: Never  Substance Use Topics   Alcohol use: Not Currently   Drug use: Not Currently    Family Medical History: History reviewed. No pertinent family history.  Physical Examination: Vitals:   08/11/23 0907  BP: (!) 88/60    General: Patient is well developed, well  nourished, calm, collected, and in no apparent distress. Attention to examination is appropriate.  Respiratory: Patient is breathing without any difficulty.   NEUROLOGICAL:     Awake, alert, oriented to person, place, and time.  Speech is clear and fluent. Fund of knowledge is appropriate.   Cranial Nerves: Pupils equal round and reactive to light.  Facial tone is symmetric.    Mild lower posterior lumbar tenderness.   No abnormal lesions on exposed skin.   Strength: Side Biceps Triceps Deltoid Interossei Grip  Wrist Ext. Wrist Flex.  R 5 5 5 5 5 5 5   L 5 5 5 5 5 5 5    Side Iliopsoas Quads Hamstring PF DF EHL  R 5 5 5 5 5 5   L 5 5 5 5 5 5    Reflexes are 2+ and symmetric at the biceps, brachioradialis, patella and achilles.   Hoffman's is absent.  Clonus is not present.   Bilateral upper and lower extremity sensation is intact to light touch.    No pain with IR/ER of both hips.    He has abnormal gait (has Parkinson's).     Medical Decision Making  Imaging: CT of abdomen/pelvis dated 06/10/23:  FINDINGS: Lower chest: No acute abnormality.   Hepatobiliary: No suspicious hepatic lesion. Gallbladder is unremarkable. No biliary ductal dilation.   Pancreas: No pancreatic ductal dilation or evidence of acute inflammation.   Spleen: No splenomegaly.   Adrenals/Urinary Tract: Bilateral adrenal glands appear normal. No hydronephrosis. 3.7 cm left renal cyst is considered benign and requiring no independent imaging follow-up. Additional millimetric bilateral renal lesions are too small to accurately characterize but statistically likely to reflect cysts and considered benign requiring no independent imaging follow-up. Kidneys demonstrate symmetric enhancement. Urinary bladder is unremarkable for degree of distension.   Stomach/Bowel: Radiopaque enteric contrast material traverses the hepatic flexure. Stomach is minimally distended limiting evaluation. No pathologic dilation of small or large bowel. Small to moderate volume of stool in the colon. No evidence of acute bowel inflammation.   Vascular/Lymphatic: Normal caliber abdominal aorta. Smooth IVC contours. The portal, splenic and superior mesenteric veins are patent. No pathologically enlarged abdominal or pelvic lymph nodes.   Reproductive: Prostate is unremarkable.   Other: No significant abdominopelvic free fluid.   Musculoskeletal: Advanced multilevel degenerative change of the spine. Age indeterminate T12 superior endplate  compression deformity associated with a Schmorl's node there is vacuum disc artifact at this level.   IMPRESSION: 1. No acute abnormality in the abdomen or pelvis. 2. Age indeterminate T12 superior endplate compression deformity associated with a Schmorl's node there is vacuum disc artifact at this level. Correlate with point tenderness and consider further evaluation with MRI     Electronically Signed   By: Maudry Mayhew M.D.   On: 06/14/2023 09:56    Lumbar xrays dated 12/25/22:  IMPRESSION:  1.  No acute fracture or traumatic malalignment. 2.  Dextrocurvature of the lumbar spine centered at L2-L3. 3.  Multilevel degenerative disc disease, most pronounced and advanced from L2 to L5. 4.  Multilevel facet arthropathy, most pronounced in the lower lumbar spine. Procedure Note  Lorelee Cover, MD - 12/26/2022 Formatting of this note might be different from the original.  I have personally reviewed the images and agree with the above interpretation.  Assessment and Plan: Mr. Fetterhoff has intermittent LBP, left > right side, with no leg pain. Pain is worse when getting out of bed and when getting up after sitting. He does okay when  he is up and walking.   He has known Parkinson's.   He has known lumbar scoliosis, with diffuse spondylosis and DDD. LBP is likely due to underlying spondylosis.   Treatment options discussed with patient and following plan made:   - Recommend lumbar MRI for further evaluation of chronic LBP.  - PT for lumbar spine recommended. He declines for now.  - Depending on results of MRI, would consider referral for injections (?facet injections).  - He wants to think about above recommendations. He will call and let me know if he wants to proceed.   - BP was a little low. No symptoms of dizziness or blurry vision. He feels well. Recheck was better. He will recheck it at home and follow up with his PCP if it remains low. If he has any dizziness,  blurry vision, or he feels like he is going to pass out then he will go to the emergency room. He checks BP at home and it generally is in 110s/60-80s.   I spent a total of 30 minutes in face-to-face and non-face-to-face activities related to this patient's care today including review of outside records, review of imaging, review of symptoms, physical exam, discussion of differential diagnosis, discussion of treatment options, and documentation.   Thank you for involving me in the care of this patient.   Drake Leach PA-C Dept. of Neurosurgery

## 2023-08-11 ENCOUNTER — Encounter: Payer: Self-pay | Admitting: Orthopedic Surgery

## 2023-08-11 ENCOUNTER — Ambulatory Visit: Payer: BC Managed Care – PPO | Admitting: Orthopedic Surgery

## 2023-08-11 VITALS — BP 90/62 | Ht 69.0 in | Wt 223.0 lb

## 2023-08-11 DIAGNOSIS — M5136 Other intervertebral disc degeneration, lumbar region with discogenic back pain only: Secondary | ICD-10-CM

## 2023-08-11 DIAGNOSIS — M419 Scoliosis, unspecified: Secondary | ICD-10-CM

## 2023-08-11 DIAGNOSIS — M47816 Spondylosis without myelopathy or radiculopathy, lumbar region: Secondary | ICD-10-CM | POA: Diagnosis not present

## 2023-08-11 NOTE — Patient Instructions (Signed)
It was so nice to see you today. Thank you so much for coming in.    You have some wear and tear in your back (arthritis) and I think this is causing your pain.   I recommend getting an MRI of your back to look into things further. Once we have this MRI, we can consider referring you for lumbar injections. We should be able to get the MRI done prior to the end of the year.   We can also consider physical therapy for your back. Let me know if you want to do this.   Your blood pressure was low today. I want you to recheck it at home and follow up with your PCP if it remains low. If you have any dizziness, blurry vision, or feel like you are going to pass out then you need to go to the emergency room.   Please do not hesitate to call if you have any questions or concerns. You can also message me in MyChart.   Drake Leach PA-C [phone]     The physicians and staff at [facility] at [facility] are committed to providing excellent care. You may receive a survey asking for feedback about your experience at our office. We value you your feedback and appreciate you taking the time to to fill it out. The [facility] leadership team is also available to discuss your experience in person, feel free to contact us [phone].

## 2023-08-17 ENCOUNTER — Telehealth: Payer: Self-pay | Admitting: Orthopedic Surgery

## 2023-08-17 NOTE — Telephone Encounter (Signed)
I would have him ask the nephrologist (kidney doctor) about the magnesium supplement.

## 2023-08-17 NOTE — Telephone Encounter (Signed)
-----   Message from Brady [name] sent at 08/17/2023  8:43 AM EST ----- Patient states that he would like to hold off on the lumbar MRI for now. He would like to know with his kidney issues, if he could take a magnesium supplement for the spasms in his toes. He states that his neurologist recommended this a while back. ----- Message ----- From: Drake Leach, PA-C Sent: 08/17/2023   8:13 AM EST To: [facility]  Please call to check on him. I saw him last week and we discussed getting a lumbar MRI. He wanted to think about it.   Does he want to get MRI? Does he have any other questions? Please let me know.   Thanks.

## 2023-08-28 ENCOUNTER — Encounter: Payer: Self-pay | Admitting: Podiatry

## 2023-08-28 ENCOUNTER — Ambulatory Visit (INDEPENDENT_AMBULATORY_CARE_PROVIDER_SITE_OTHER): Payer: BC Managed Care – PPO

## 2023-08-28 ENCOUNTER — Ambulatory Visit (INDEPENDENT_AMBULATORY_CARE_PROVIDER_SITE_OTHER): Payer: BC Managed Care – PPO | Admitting: Podiatry

## 2023-08-28 DIAGNOSIS — M205X1 Other deformities of toe(s) (acquired), right foot: Secondary | ICD-10-CM

## 2023-08-28 DIAGNOSIS — M205X2 Other deformities of toe(s) (acquired), left foot: Secondary | ICD-10-CM

## 2023-08-28 DIAGNOSIS — B49 Unspecified mycosis: Secondary | ICD-10-CM | POA: Diagnosis not present

## 2023-08-28 NOTE — Patient Instructions (Addendum)
Start a BIOTIN supplement to help with the nails.  You can also use Theraworx to help with the cramping as well.    I will let you know when I get the results back on the nail culture to determine the best course of treatment for the nail.   The pads we did are called "toe crests" and you can get these online. The company I like is "Dr. Markham Jordan".

## 2023-08-28 NOTE — Progress Notes (Unsigned)
Subjective:   Patient ID: Brady [name], male   DOB: 62 y.o.   MRN: [id number]   HPI Chief Complaint  Patient presents with   Nail Problem    RM#14 np re est care/ bil nails possible fungus/ hx of parkinson and sometimes drag the toes. Patient states tried a 30 day medication for fungus and it did not work.    Used OTC medication  Toe spasms now- lasts about 1 hour- added a muscle relaxine tizandine and it helps    ROS      Objective:  Physical Exam  ***     Assessment:  ***     Plan:  ***    -mallet toes -nail culture

## 2023-09-21 ENCOUNTER — Other Ambulatory Visit: Payer: Self-pay | Admitting: Podiatry

## 2023-10-29 ENCOUNTER — Telehealth: Payer: Self-pay | Admitting: Podiatry

## 2023-10-29 NOTE — Telephone Encounter (Signed)
 Was seen 08/28/23 and a culture was done on toenail. He has not received any information on the results or what needs to be done.

## 2024-08-12 ENCOUNTER — Ambulatory Visit: Payer: Self-pay

## 2024-08-12 NOTE — Telephone Encounter (Signed)
 FYI Only or Action Required?: FYI only for provider: ED advised.  Patient is followed in Pulmonology for n/a, last seen on n/a.  Called Nurse Triage reporting Shortness of Breath.  Symptoms began several months ago.  Interventions attempted: Rescue inhaler.  Symptoms are: rapidly worsening.  Triage Disposition: Go to ED Now (Notify PCP)  Patient/caregiver understands and will follow disposition?: Yes  Copied from CRM #[id number]. Topic: Clinical - Red Word Triage >> Aug 12, 2024  3:16 PM Joesph PARAS wrote: Red Word that prompted transfer to Nurse Triage: Progressively worsening SOB with wheezing and voice changes. Patient now scheduled to establish care as a new patient with Dr. Tamala Adrien Guan with [facility] 08/18/2024 with referral not yet received. Reason for Disposition  [1] MODERATE difficulty breathing (e.g., speaks in phrases, SOB even at rest, pulse 100-120) AND [2] NEW-onset or WORSE than normal  Answer Assessment - Initial Assessment Questions Pt establishing care with pulm. States he was seen by a different pulmonologist and all they did was a sleep study. Pt is audibly wheezing. Rn asked how long this has been going on. Pt states wheezing a couple of months but has become louder and more prominent. He states yesterday seemed to be pivotal day where it changed. He doesn't know why. But states it became louder and more short of breath. Pt is stopping every few words to take deep breath. Rn did advise pt to go to the ER and keep New pt appt. Pt continued to ask questions. RN answered questions and had him check pulse ox. Pt is still having loud wheezing. RN explained what causes wheezing and since is having to take deep breaths every few words concerned as to why. Pt states understanding and states that he is also having pins in needles in his left arm. Rn advised pt he need to get to the ER asap. He asked what they will do there as opposed to waiting for an office visit. RN  explained and asked if he had a driver. He stated he does and stated understanding. Did not finish all triage questions due to ER dispo.    1. RESPIRATORY STATUS: Describe your breathing? (e.g., wheezing, shortness of breath, unable to speak, severe coughing)      Wheezing, shortness of breath 2. ONSET: When did this breathing problem begin?      A couple months worse last night  3. SEVERITY: How bad is your breathing? (e.g., mild, moderate, severe)      moderate 5. RECURRENT SYMPTOM: Have you had difficulty breathing before? If Yes, ask: When was the last time? and What happened that time?      no 6. CARDIAC HISTORY: Do you have any history of heart disease? (e.g., heart attack, angina, bypass surgery, angioplasty)      htn 7. LUNG HISTORY: Do you have any history of lung disease?  (e.g., pulmonary embolus, asthma, emphysema)     No just recently sent referral for pulm 8. CAUSE: What do you think is causing the breathing problem?      unknown 9. OTHER SYMPTOMS: Do you have any other symptoms? (e.g., chest pain, cough, dizziness, fever, runny nose)     Denies any other symptoms.  10. O2 SATURATION MONITOR:  Do you use an oxygen saturation monitor (pulse oximeter) at home? If Yes, ask: What is your reading (oxygen level) today? What is your usual oxygen saturation reading? (e.g., 95%)       94 %  Protocols used: Breathing  Difficulty-A-AH

## 2024-08-17 NOTE — Progress Notes (Signed)
 New Patient Pulmonology Office Visit   Subjective:  Patient ID: Brady [name], male    DOB: 17-Jan-1961  MRN: [id number]  Referred by: Millicent Sharper, MD  CC: No chief complaint on file.   HPI Brady [name] is a 63 y.o. male with ,lifelong nonsmoker with Parkinson's, obesity and recent weight gain presents with exertional wheezing and may have asthma.   The PFT shows moderate restriction today. His voice has been hoarse. He has some mild wheezing. No history of asthma. He has a 6mm lung nodule that was seen on the RUl on a cardiac ct scan. He has not tired albuterol . He has more wheezing with exertion. He does not have a daily coughing. No fever. No chills.  WENT TO a pulm a month ago  Albuterol  And PSG   Discussed the use of AI scribe software for clinical note transcription with the patient, who gave verbal consent to proceed.  History of Present Illness     {PULM QUESTIONNAIRES (Optional):33196}  ROS  Allergies: Patient has no known allergies. Current Medications[1] Past Medical History:  Diagnosis Date   Anxiety    Carotid artery stenosis    Dyshydrosis    Hyperlipidemia    Hypertension    Past Surgical History:  Procedure Laterality Date   ORIF HUMERUS FRACTURE Right 11/18/2018   Procedure: OPEN REDUCTION INTERNAL FIXATION (ORIF) PROXIMAL HUMERUS FRACTURE;  Surgeon: Melita Drivers, MD;  Location: [facility];  Service: Orthopedics;  Laterality: Right;   No family history on file. Social History   Socioeconomic History   Marital status: Married    Spouse name: Not on file   Number of children: Not on file   Years of education: Not on file   Highest education level: Not on file  Occupational History   Not on file  Tobacco Use   Smoking status: Never   Smokeless tobacco: Never  Substance and Sexual Activity   Alcohol use: Not Currently   Drug use: Not Currently   Sexual activity: Not on file  Other Topics Concern   Not on file  Social History  Narrative   Not on file   Social Drivers of Health   Tobacco Use: Low Risk (07/26/2024)   Received from [facility]   Patient History    Smoking Tobacco Use: Never    Smokeless Tobacco Use: Never    Passive Exposure: Not on file  Financial Resource Strain: Low Risk (03/08/2024)   Received from [facility]   Overall Financial Resource Strain (CARDIA)    Difficulty of Paying Living Expenses: Not hard at all  Food Insecurity: No Food Insecurity (03/08/2024)   Received from [facility]   Epic    Within the past 12 months, you worried that your food would run out before you got the money to buy more.: Never true    Within the past 12 months, the food you bought just didn't last and you didn't have money to get more.: Never true  Transportation Needs: No Transportation Needs (03/08/2024)   Received from [facility] - Transportation    Lack of Transportation (Medical): No    Lack of Transportation (Non-Medical): No  Physical Activity: Sufficiently Active (12/25/2021)   Received from [facility]   Exercise Vital Sign    On average, how many days per week do you engage in moderate to strenuous exercise (like a brisk walk)?: 4 days    On average, how many minutes do you engage in exercise at this  level?: 60 min  Stress: Stress Concern Present (12/25/2021)   Received from [facility] - Occupational Stress Questionnaire    Feeling of Stress : To some extent  Social Connections: Moderately Integrated (12/25/2021)   Received from [facility]   Social Network    How would you rate your social network (family, work, friends)?: Adequate participation with social networks  Intimate Partner Violence: Not At Risk (12/25/2021)   Received from [facility]   HITS    Over the last 12 months how often did your partner scream or curse at you?: Never    Over the last 12 months how often did your partner threaten you with physical harm?: Never     Over the last 12 months how often did your partner insult you or talk down to you?: Never    Over the last 12 months how often did your partner physically hurt you?: Never  Depression (PHQ2-9): Not on file  Alcohol Screen: Not on file  Housing: Low Risk (03/08/2024)   Received from [facility]    In the last 12 months, was there a time when you were not able to pay the mortgage or rent on time?: No    In the past 12 months, how many times have you moved where you were living?: 0    At any time in the past 12 months, were you homeless or living in a shelter (including now)?: No  Utilities: Not At Risk (03/08/2024)   Received from [facility]    Threatened with loss of utilities: No  Health Literacy: Not on file       Objective:  There were no vitals taken for this visit. {Pulm Vitals (Optional):32837}  Physical Exam  Diagnostic Review:  {Labs (Optional):32838}     Assessment & Plan:   Assessment & Plan  Assessment and Plan Assessment & Plan    No follow-ups on file.    Marny Patch, MD Pulmonary and Critical Care Medicine [facility]     [1]  Current Outpatient Medications:    carbidopa -levodopa  (SINEMET  CR) 50-200 MG tablet, Take 1 tablet by mouth 2 (two) times daily., Disp: , Rfl:    docusate sodium  (COLACE) 100 MG capsule, Take 1 capsule (100 mg total) by mouth every 12 (twelve) hours., Disp: 60 capsule, Rfl: 0   lisinopril (ZESTRIL) 10 MG tablet, Take 10 mg by mouth daily., Disp: , Rfl:    lovastatin (MEVACOR) 20 MG tablet, Take 20 mg by mouth daily. , Disp: , Rfl:    rOPINIRole (REQUIP XL) 8 MG 24 hr tablet, Take 8 mg by mouth at bedtime., Disp: , Rfl:    sertraline (ZOLOFT) 100 MG tablet, Take 1 tablet every day by oral route., Disp: , Rfl:    tiZANidine (ZANAFLEX) 4 MG tablet, Take by mouth., Disp: , Rfl:

## 2024-08-18 ENCOUNTER — Ambulatory Visit

## 2024-08-18 VITALS — BP 101/74 | HR 63 | Temp 97.8°F | Ht 70.0 in | Wt 244.2 lb

## 2024-08-18 DIAGNOSIS — R911 Solitary pulmonary nodule: Secondary | ICD-10-CM | POA: Diagnosis not present

## 2024-08-18 DIAGNOSIS — J4541 Moderate persistent asthma with (acute) exacerbation: Secondary | ICD-10-CM

## 2024-08-18 MED ORDER — PREDNISONE 10 MG PO TABS: ORAL_TABLET | ORAL | 0 refills | Status: AC

## 2024-08-18 MED ORDER — ALBUTEROL SULFATE HFA 108 (90 BASE) MCG/ACT IN AERS: 1.0000 | INHALATION_SPRAY | Freq: Four times a day (QID) | RESPIRATORY_TRACT | 6 refills | Status: AC | PRN

## 2024-08-18 NOTE — Patient Instructions (Addendum)
 Dear Brady [name],   Given your symptoms, you have asthma and significant wheezing:  -  Prednisone  taper: 40 mg for 4 days, then 30 mg for 3 days, 20 mg for 3 days and 10 mg for 2 days. - Symbicort is your daily inhaler, 2 puffs twice a day. Use it with the spacer.  - Albuterol  1 puff as need for significant shortness of breath and wheezing. You can use it as a rescue therapy. - Chest Xray today. -If your symptoms get worst, as worsening shortness of breath, chest tightness, or you can't breath, please don't hesitate to go to the ER or call 911 for evaluation and treatment.   I will see you in 4 weeks.   INHALER INSTRUCTIONS: To use the inhaler you follow these steps: Prime the inhaler according to instructions (which means waste between 1-4 doses before next use).  Follow package insert Shake the inhaler before each use Connect spacer Exhale completely by blowing all the air out of your lungs Seal your mouth around the spacer Press down on the canister then inhale SLOW and STEADY until your fill your lungs completely. Hold the breath for 6-10 seconds Gently exhale Wait 60 seconds then repeat steps 2-8. Rinse the mouth after each use to prevent thrush  Your pharmacist can also review proper technique if you have any remaining questions. Let me know if the cost is too high, your insurance may be able to recommend a more affordable option for you.

## 2024-08-30 ENCOUNTER — Telehealth: Payer: Self-pay

## 2024-08-30 NOTE — Telephone Encounter (Signed)
 Copied from CRM [id number]. Topic: Clinical - Lab/Test Results >> Aug 30, 2024  3:30 PM Rilla B wrote: Reason for CRM: Patient states he had a scan and has not heard anything about his results. Please call patient at [phone]   Called and spoke with the pt and advised x-ray has not been resulted yet- once it has been Dr. Adrien will review.

## 2024-09-05 NOTE — Telephone Encounter (Signed)
 Copied from CRM [id number]. Topic: Clinical - Lab/Test Results >> Aug 30, 2024  3:30 PM Rilla B wrote: Reason for CRM: Patient states he had a scan and has not heard anything about his results. Please call patient at [phone] >> Sep 05, 2024  9:32 AM Leila BROCKS wrote: Patient [phone] is checking on chest xray results. Informed patient, across [facility] due to shortage of radiologists results can take 3-4 weeks. Patient verbalized understanding, patient would like a call back, states no one call back. Patient wants to speak with the office manager. Per CAL,send crm to clinical.    Routing to [place].

## 2024-09-05 NOTE — Telephone Encounter (Signed)
 It does not appear that imaging has been read by radiologist.   I have contacted [facility] imaging and spoke to [facility] and asked for reading to be expedited.   I spoke to patient and relayed above message.  Routing to Dr. Adrien to be on the lookout for imaging.   Patient would like a detailed message with results if he does not answer.

## 2024-09-06 NOTE — Telephone Encounter (Signed)
 Left detailed message for patient per his request.  NFN.

## 2024-10-26 ENCOUNTER — Ambulatory Visit
